# Patient Record
Sex: Male | Born: 1955
Health system: Southern US, Community
[De-identification: ages and names within clinical notes are randomized; demographics above are authoritative.]

## PROBLEM LIST (undated history)

## (undated) DIAGNOSIS — E119 Type 2 diabetes mellitus without complications: Secondary | ICD-10-CM

## (undated) DIAGNOSIS — I10 Essential (primary) hypertension: Secondary | ICD-10-CM

## (undated) DIAGNOSIS — C4491 Basal cell carcinoma of skin, unspecified: Secondary | ICD-10-CM

## (undated) HISTORY — DX: Essential (primary) hypertension: I10

## (undated) HISTORY — DX: Type 2 diabetes mellitus without complications: E11.9

## (undated) HISTORY — PX: FINGER FRACTURE SURGERY: SHX638

## (undated) HISTORY — PX: EYE SURGERY: SHX253

## (undated) HISTORY — PX: BASAL CELL CARCINOMA EXCISION: SHX1214

## (undated) HISTORY — PX: WISDOM TOOTH EXTRACTION: SHX21

---
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
## 2001-07-06 ENCOUNTER — Encounter: Payer: Self-pay | Admitting: Emergency Medicine

## 2001-07-07 ENCOUNTER — Ambulatory Visit (HOSPITAL_COMMUNITY): Admission: RE | Admit: 2001-07-07 | Discharge: 2001-07-08 | Payer: Self-pay | Admitting: Cardiology

## 2004-06-28 ENCOUNTER — Ambulatory Visit (HOSPITAL_BASED_OUTPATIENT_CLINIC_OR_DEPARTMENT_OTHER): Admission: RE | Admit: 2004-06-28 | Discharge: 2004-06-28 | Payer: Self-pay | Admitting: Otolaryngology

## 2015-01-01 ENCOUNTER — Ambulatory Visit
Admission: RE | Admit: 2015-01-01 | Discharge: 2015-01-01 | Disposition: A | Discharge status: Home / Self Care | Payer: BLUE CROSS/BLUE SHIELD | Source: Ambulatory Visit | Attending: Physician Assistant | Admitting: Physician Assistant

## 2015-01-01 ENCOUNTER — Other Ambulatory Visit: Payer: Self-pay | Admitting: Physician Assistant

## 2015-01-01 DIAGNOSIS — J209 Acute bronchitis, unspecified: Secondary | ICD-10-CM

## 2017-09-04 ENCOUNTER — Other Ambulatory Visit (HOSPITAL_COMMUNITY): Payer: Self-pay | Admitting: Family Medicine

## 2017-09-04 DIAGNOSIS — R0989 Other specified symptoms and signs involving the circulatory and respiratory systems: Secondary | ICD-10-CM

## 2017-09-08 ENCOUNTER — Ambulatory Visit (HOSPITAL_COMMUNITY)
Admission: RE | Admit: 2017-09-08 | Discharge: 2017-09-08 | Disposition: A | Discharge status: Home / Self Care | Payer: 59 | Source: Ambulatory Visit | Attending: Vascular Surgery | Admitting: Vascular Surgery

## 2017-09-08 DIAGNOSIS — R0989 Other specified symptoms and signs involving the circulatory and respiratory systems: Secondary | ICD-10-CM | POA: Diagnosis present

## 2018-01-20 DIAGNOSIS — I10 Essential (primary) hypertension: Secondary | ICD-10-CM | POA: Diagnosis not present

## 2018-01-20 DIAGNOSIS — E119 Type 2 diabetes mellitus without complications: Secondary | ICD-10-CM | POA: Diagnosis not present

## 2018-01-20 DIAGNOSIS — E785 Hyperlipidemia, unspecified: Secondary | ICD-10-CM | POA: Diagnosis not present

## 2018-01-20 DIAGNOSIS — Z794 Long term (current) use of insulin: Secondary | ICD-10-CM | POA: Diagnosis not present

## 2018-03-23 DIAGNOSIS — E1169 Type 2 diabetes mellitus with other specified complication: Secondary | ICD-10-CM | POA: Diagnosis not present

## 2018-03-23 DIAGNOSIS — E785 Hyperlipidemia, unspecified: Secondary | ICD-10-CM | POA: Diagnosis not present

## 2018-03-23 DIAGNOSIS — I10 Essential (primary) hypertension: Secondary | ICD-10-CM | POA: Diagnosis not present

## 2018-03-23 DIAGNOSIS — Z794 Long term (current) use of insulin: Secondary | ICD-10-CM | POA: Diagnosis not present

## 2018-04-10 DIAGNOSIS — H25043 Posterior subcapsular polar age-related cataract, bilateral: Secondary | ICD-10-CM | POA: Diagnosis not present

## 2018-04-10 DIAGNOSIS — H52221 Regular astigmatism, right eye: Secondary | ICD-10-CM | POA: Diagnosis not present

## 2018-04-10 DIAGNOSIS — E119 Type 2 diabetes mellitus without complications: Secondary | ICD-10-CM | POA: Diagnosis not present

## 2018-04-10 DIAGNOSIS — H5213 Myopia, bilateral: Secondary | ICD-10-CM | POA: Diagnosis not present

## 2018-05-07 DIAGNOSIS — H2513 Age-related nuclear cataract, bilateral: Secondary | ICD-10-CM | POA: Diagnosis not present

## 2018-05-14 ENCOUNTER — Other Ambulatory Visit: Payer: Self-pay

## 2018-05-14 ENCOUNTER — Encounter: Payer: Self-pay | Admitting: Podiatry

## 2018-05-14 ENCOUNTER — Ambulatory Visit: Payer: BLUE CROSS/BLUE SHIELD | Admitting: Podiatry

## 2018-05-14 DIAGNOSIS — L84 Corns and callosities: Secondary | ICD-10-CM | POA: Diagnosis not present

## 2018-05-14 DIAGNOSIS — E1151 Type 2 diabetes mellitus with diabetic peripheral angiopathy without gangrene: Secondary | ICD-10-CM

## 2018-05-14 DIAGNOSIS — Q828 Other specified congenital malformations of skin: Secondary | ICD-10-CM

## 2018-05-14 DIAGNOSIS — M79675 Pain in left toe(s): Secondary | ICD-10-CM | POA: Diagnosis not present

## 2018-05-14 DIAGNOSIS — B353 Tinea pedis: Secondary | ICD-10-CM

## 2018-05-14 DIAGNOSIS — M79674 Pain in right toe(s): Secondary | ICD-10-CM | POA: Diagnosis not present

## 2018-05-14 DIAGNOSIS — B351 Tinea unguium: Secondary | ICD-10-CM

## 2018-05-14 DIAGNOSIS — I10 Essential (primary) hypertension: Secondary | ICD-10-CM

## 2018-05-14 DIAGNOSIS — E119 Type 2 diabetes mellitus without complications: Secondary | ICD-10-CM

## 2018-05-14 MED ORDER — KETOCONAZOLE 2 % EX CREA: TOPICAL_CREAM | Freq: Every day | CUTANEOUS | Status: DC

## 2018-05-14 MED ORDER — NONFORMULARY OR COMPOUNDED ITEM: 11 refills | Status: AC

## 2018-05-14 NOTE — Patient Instructions (Addendum)
Onychomycosis/Fungal Toenails  WHAT IS IT? An infection that lies within the keratin of your nail plate that is caused by a fungus.  WHY ME? Fungal infections affect all ages, sexes, races, and creeds.  There may be many factors that predispose you to a fungal infection such as age, coexisting medical conditions such as diabetes, or an autoimmune disease; stress, medications, fatigue, genetics, etc.  Bottom line: fungus thrives in a warm, moist environment and your shoes offer such a location.  IS IT CONTAGIOUS? Theoretically, yes.  You do not want to share shoes, nail clippers or files with someone who has fungal toenails.  Walking around barefoot in the same room or sleeping in the same bed is unlikely to transfer the organism.  It is important to realize, however, that fungus can spread easily from one nail to the next on the same foot.  HOW DO WE TREAT THIS?  There are several ways to treat this condition.  Treatment may depend on many factors such as age, medications, pregnancy, liver and kidney conditions, etc.  It is best to ask your doctor which options are available to you.  1. No treatment.   Unlike many other medical concerns, you can live with this condition.  However for many people this can be a painful condition and may lead to ingrown toenails or a bacterial infection.  It is recommended that you keep the nails cut short to help reduce the amount of fungal nail. 2. Topical treatment.  These range from herbal remedies to prescription strength nail lacquers.  About 40-50% effective, topicals require twice daily application for approximately 9 to 12 months or until an entirely new nail has grown out.  The most effective topicals are medical grade medications available through physicians offices. 3. Oral antifungal medications.  With an 80-90% cure rate, the most common oral medication requires 3 to 4 months of therapy and stays in your system for a year as the new nail grows out.  Oral  antifungal medications do require blood work to make sure it is a safe drug for you.  A liver function panel will be performed prior to starting the medication and after the first month of treatment.  It is important to have the blood work performed to avoid any harmful side effects.  In general, this medication safe but blood work is required. 4. Laser Therapy.  This treatment is performed by applying a specialized laser to the affected nail plate.  This therapy is noninvasive, fast, and non-painful.  It is not covered by insurance and is therefore, out of pocket.  The results have been very good with a 80-95% cure rate.  [facility] is the only practice in the area to offer this therapy. Permanent Nail Avulsion.  Removing the entire nail so that a new nail will not grow back.Athlete's Foot Athlete's foot (tinea pedis) is a fungal infection of the skin on the feet. It often occurs on the skin that is between or underneath the toes. It can also occur on the soles of the feet. The infection can spread from person to person (is contagious). What are the causes? Athlete's foot is caused by a fungus. This fungus grows in warm, moist places. Most people get athlete's foot by sharing shower stalls, towels, and wet floors with someone who is infected. Not washing your feet or changing your socks often enough can contribute to athlete's foot. What increases the risk? This condition is more likely to develop in:    Men.  People who have a weak body defense system (immune system).  People who have diabetes.  People who use public showers, such as at a gym.  People who wear heavy-duty shoes, such as industrial or military shoes.  Seasons with warm, humid weather.  What are the signs or symptoms? Symptoms of this condition include:  Itchy areas between the toes or on the soles of the feet.  White, flaky, or scaly areas between the toes or on the soles of the feet.  Very itchy small blisters  between the toes or on the soles of the feet.  Small cuts on the skin. These cuts can become infected.  Thick or discolored toenails.  How is this diagnosed? This condition is diagnosed with a medical history and physical exam. Your health care provider may also take a skin or toenail sample to be examined. How is this treated? Treatment for this condition includes antifungal medicines. These may be applied as powders, ointments, or creams. In severe cases, an oral antifungal medicine may be given. Follow these instructions at home:  Apply or take over-the-counter and prescription medicines only as told by your health care provider.  Keep all follow-up visits as told by your health care provider. This is important.  Do not scratch your feet.  Keep your feet dry: ? Wear cotton or wool socks. Change your socks every day or if they become wet. ? Wear shoes that allow air to circulate, such as sandals or canvas tennis shoes.  Wash and dry your feet: ? Every day or as told by your health care provider. ? After exercising. ? Including the area between your toes.  Do not share towels, nail clippers, or other personal items that touch your feet with others.  If you have diabetes, keep your blood sugar under control. How is this prevented?  Do not share towels.  Wear sandals in wet areas, such as locker rooms and shared showers.  Keep your feet dry: ? Wear cotton or wool socks. Change your socks every day or if they become wet. ? Wear shoes that allow air to circulate, such as sandals or canvas tennis shoes.  Wash and dry your feet after exercising. Pay attention to the area between your toes. Contact a health care provider if:  You have a fever.  You have swelling, soreness, warmth, or redness in your foot.  You are not getting better with treatment.  Your symptoms get worse.  You have new symptoms. This information is not intended to replace advice given to you by your  health care provider. Make sure you discuss any questions you have with your health care provider. Document Released: 10/10/2000 Document Revised: 03/20/2016 Document Reviewed: 04/16/2015 Elsevier Interactive Patient Education  2018 Elsevier Inc.  Diabetes and Foot Care Diabetes may cause you to have problems because of poor blood supply (circulation) to your feet and legs. This may cause the skin on your feet to become thinner, break easier, and heal more slowly. Your skin may become dry, and the skin may peel and crack. You may also have nerve damage in your legs and feet causing decreased feeling in them. You may not notice minor injuries to your feet that could lead to infections or more serious problems. Taking care of your feet is one of the most important things you can do for yourself. Follow these instructions at home:  Wear shoes at all times, even in the house. Do not go barefoot. Bare feet are easily   injured.  Check your feet daily for blisters, cuts, and redness. If you cannot see the bottom of your feet, use a mirror or ask someone for help.  Wash your feet with warm water (do not use hot water) and mild soap. Then pat your feet and the areas between your toes until they are completely dry. Do not soak your feet as this can dry your skin.  Apply a moisturizing lotion or petroleum jelly (that does not contain alcohol and is unscented) to the skin on your feet and to dry, brittle toenails. Do not apply lotion between your toes.  Trim your toenails straight across. Do not dig under them or around the cuticle. File the edges of your nails with an emery board or nail file.  Do not cut corns or calluses or try to remove them with medicine.  Wear clean socks or stockings every day. Make sure they are not too tight. Do not wear knee-high stockings since they may decrease blood flow to your legs.  Wear shoes that fit properly and have enough cushioning. To break in new shoes, wear them  for just a few hours a day. This prevents you from injuring your feet. Always look in your shoes before you put them on to be sure there are no objects inside.  Do not cross your legs. This may decrease the blood flow to your feet.  If you find a minor scrape, cut, or break in the skin on your feet, keep it and the skin around it clean and dry. These areas may be cleansed with mild soap and water. Do not cleanse the area with peroxide, alcohol, or iodine.  When you remove an adhesive bandage, be sure not to damage the skin around it.  If you have a wound, look at it several times a day to make sure it is healing.  Do not use heating pads or hot water bottles. They may burn your skin. If you have lost feeling in your feet or legs, you may not know it is happening until it is too late.  Make sure your health care provider performs a complete foot exam at least annually or more often if you have foot problems. Report any cuts, sores, or bruises to your health care provider immediately. Contact a health care provider if:  You have an injury that is not healing.  You have cuts or breaks in the skin.  You have an ingrown nail.  You notice redness on your legs or feet.  You feel burning or tingling in your legs or feet.  You have pain or cramps in your legs and feet.  Your legs or feet are numb.  Your feet always feel cold. Get help right away if:  There is increasing redness, swelling, or pain in or around a wound.  There is a red line that goes up your leg.  Pus is coming from a wound.  You develop a fever or as directed by your health care provider.  You notice a bad smell coming from an ulcer or wound. This information is not intended to replace advice given to you by your health care provider. Make sure you discuss any questions you have with your health care provider. Document Released: 10/10/2000 Document Revised: 03/20/2016 Document Reviewed: 03/22/2013 Elsevier  Interactive Patient Education  2017 Elsevier Inc.  

## 2018-05-16 ENCOUNTER — Encounter: Payer: Self-pay | Admitting: Podiatry

## 2018-05-16 DIAGNOSIS — E119 Type 2 diabetes mellitus without complications: Secondary | ICD-10-CM | POA: Insufficient documentation

## 2018-05-16 DIAGNOSIS — I10 Essential (primary) hypertension: Secondary | ICD-10-CM | POA: Insufficient documentation

## 2018-05-16 MED ORDER — KETOCONAZOLE 2 % EX CREA: 1.0000 "application " | TOPICAL_CREAM | Freq: Every day | CUTANEOUS | 0 refills | Status: AC

## 2018-05-16 NOTE — Progress Notes (Signed)
Subjective: Manuel Romero is a 62 yo WM who presents today with cc of thick, discolored, dystrophic toenails b/l feet. He states he was recently diagnosed with diabetes.  Pain is aggravated when wearing enclosed shoe gear. Pain is relieved with periodic professional debridement.  Medical History   Date Unknown HTN (hypertension)  Date Unknown Type 2 diabetes mellitus ([place])     Surgical History    None   Tobacco History    Smoking Status  Never Smoker  Smokeless Tobacco Status  Never Used   Family History    Brother Diabetes    Neuropathy     ROS: Per HPI unless specifically indicated in ROS section   Objective: There were no vitals filed for this visit. Vascular Examination: Capillary refill time <3 seconds x 10 digits. Dorsalis pedis and posterior tibial pulses faintly palpable b/l Sparse digital hair x 10 digits Skin temperature warm to warm b/l  Dermatological Examination: Skin thin and atrophic b/l. Chronic venous stasis changes noted b/l ankles/feet Toenails 1-5 b/l discolored, thick, dystrophic with subungual debris and pain with palpation to nailbeds due to thickness of nails. Hyperkeratotic lesions noted submetatarsal head 1 right foot, submetatarsal head 2 left foot, plantarmedial hallux IPJ b/l, distal tip left 2nd digit Diffuse scaling with mild foot odor b/l  Musculoskeletal: Muscle strength 5/5 to all LE muscle groups  Neurological: Sensation intact with 10 gram monofilament. Vibratory sensation intact.  Assessment: 1. Painful onychomycosis toenails 1-5 b/l  2. Calluses:  submetatarsal head 1 right foot, submetatarsal head 2 left foot, plantarmedial hallux IPJ b/l,  3. Corn distal tip left 2nd digit 4. Tinea pedis b/l 5. NIDDM with early PAD  ABI report from September 09, 2107 (Dr. Deitra Mayo) Toe brachial indices normal on right; abnormal on left.  Plan: 1. Discussed diabetic foot care principles. Dispensed education materials on diabetic  foot care, onychomycosis and tinea pedis. 2. Toenails 1-5 b/l were debrided in length and girth without iatrogenic bleeding. 2.  Hyperkeratotic lesions debrided: submetatarsal head 1 right foot, submetatarsal head 2 left foot, plantarmedial hallux IPJ b/l; Corn distal tip left 2nd digit 3. Patient to continue soft, supportive shoe gear. Will check diabetic shoe benefits due to his callosities and need to offload these areas. 4. Patient to report any pedal injuries to medical professional immediately. 5. Rx written for Ketoconazole Cream for tinea pedis to be applied once daily to both feet and between toes once daily x 30 days. 6. Rx for Fluconazole/Terbinafine Solution for onychomycosis. Apply once daily to each toenail. 7. Follow up 3 months.  8. Patient to call should there be a concern in the interim.

## 2018-05-27 DIAGNOSIS — H2512 Age-related nuclear cataract, left eye: Secondary | ICD-10-CM | POA: Diagnosis not present

## 2018-05-27 DIAGNOSIS — Z961 Presence of intraocular lens: Secondary | ICD-10-CM | POA: Diagnosis not present

## 2018-05-27 DIAGNOSIS — H25811 Combined forms of age-related cataract, right eye: Secondary | ICD-10-CM | POA: Diagnosis not present

## 2018-05-28 DIAGNOSIS — H25011 Cortical age-related cataract, right eye: Secondary | ICD-10-CM | POA: Diagnosis not present

## 2018-05-28 DIAGNOSIS — H25041 Posterior subcapsular polar age-related cataract, right eye: Secondary | ICD-10-CM | POA: Diagnosis not present

## 2018-05-28 DIAGNOSIS — H2511 Age-related nuclear cataract, right eye: Secondary | ICD-10-CM | POA: Diagnosis not present

## 2018-06-15 DIAGNOSIS — S0101XA Laceration without foreign body of scalp, initial encounter: Secondary | ICD-10-CM | POA: Diagnosis not present

## 2018-06-17 DIAGNOSIS — H2511 Age-related nuclear cataract, right eye: Secondary | ICD-10-CM | POA: Diagnosis not present

## 2018-06-22 DIAGNOSIS — I10 Essential (primary) hypertension: Secondary | ICD-10-CM | POA: Diagnosis not present

## 2018-06-22 DIAGNOSIS — E1169 Type 2 diabetes mellitus with other specified complication: Secondary | ICD-10-CM | POA: Diagnosis not present

## 2018-06-22 DIAGNOSIS — Z794 Long term (current) use of insulin: Secondary | ICD-10-CM | POA: Diagnosis not present

## 2018-06-22 DIAGNOSIS — E785 Hyperlipidemia, unspecified: Secondary | ICD-10-CM | POA: Diagnosis not present

## 2018-06-22 DIAGNOSIS — Z713 Dietary counseling and surveillance: Secondary | ICD-10-CM | POA: Diagnosis not present

## 2018-07-20 DIAGNOSIS — B351 Tinea unguium: Secondary | ICD-10-CM | POA: Diagnosis not present

## 2018-07-20 DIAGNOSIS — L03116 Cellulitis of left lower limb: Secondary | ICD-10-CM | POA: Diagnosis not present

## 2018-08-16 ENCOUNTER — Ambulatory Visit: Payer: BLUE CROSS/BLUE SHIELD | Admitting: Podiatry

## 2018-08-16 ENCOUNTER — Other Ambulatory Visit: Payer: Self-pay

## 2018-08-16 DIAGNOSIS — E1151 Type 2 diabetes mellitus with diabetic peripheral angiopathy without gangrene: Secondary | ICD-10-CM | POA: Diagnosis not present

## 2018-08-16 DIAGNOSIS — M79675 Pain in left toe(s): Secondary | ICD-10-CM

## 2018-08-16 DIAGNOSIS — L84 Corns and callosities: Secondary | ICD-10-CM

## 2018-08-16 DIAGNOSIS — B351 Tinea unguium: Secondary | ICD-10-CM

## 2018-08-16 DIAGNOSIS — M79674 Pain in right toe(s): Secondary | ICD-10-CM | POA: Diagnosis not present

## 2018-08-16 DIAGNOSIS — Q828 Other specified congenital malformations of skin: Secondary | ICD-10-CM

## 2018-08-24 ENCOUNTER — Encounter: Payer: Self-pay | Admitting: Podiatry

## 2018-08-24 NOTE — Progress Notes (Signed)
Subjective: Manuel Romero presents today for diabetic foot follow up. He states he never received prescription from [facility]. They called him and told him he needed to pick up his prescription in [place], whereas they had delivered prescriptions in the past to patients.     Manuel Romero voices no other pedal complaints on today's visit.   Objective:  Neurovascular status unchanged from last examination  Dermatological Examination: Skin thin and atrophic with chronic vensous stasis skin changes noted b/l  Tinea pedis resolved b/l.  Toenails 1-5 b/l discolored, thick, dystrophic with subungual debris and pain with palpation to nailbeds due to thickness of nails.  Hyperkeratosis noted submet head 1 right foot, submet head 2 left foot, plantarmedial hallux IPJ b/l, distal tip left 2nd digit  Musculoskeletal: Muscle strength 5/5 to all LE muscle groups  Assessment: 1. Painful onychomycosis toenails 1-5 b/l 2. Calluses x 5:  submet head 1 right foot, submet head 2 left foot, plantarmedial hallux IPJ b/l, distal tip left 2nd digit 3. NIDDM with early peripheral arterial disease  Plan: 1. Toenails 1-5 b/l were debrided in length and girth without iatrogenic bleeding. 2. Callus(es) pared: submet head 1 right foot, submet head 2 left foot, plantarmedial hallux IPJ b/l, distal tip left 2nd digit 3. Discussed other options for topical antifungals for onychomycosis. He would like to think about it.  4. We discussed diabetic shoes/workboots with Pedorthist Velora Heckler, who informed his benefits do not cover diabetic shoes. Manuel Romero states he cannot afford the cost of the shoe.  5. Patient to continue soft, supportive shoe gear 6. Patient to report any pedal injuries to medical professional  7. Follow up 3 months.  8. Patient/POA to call should there be a concern in the interim.

## 2018-09-14 DIAGNOSIS — E1169 Type 2 diabetes mellitus with other specified complication: Secondary | ICD-10-CM | POA: Diagnosis not present

## 2018-09-14 DIAGNOSIS — E785 Hyperlipidemia, unspecified: Secondary | ICD-10-CM | POA: Diagnosis not present

## 2018-09-14 DIAGNOSIS — I10 Essential (primary) hypertension: Secondary | ICD-10-CM | POA: Diagnosis not present

## 2018-10-04 DIAGNOSIS — Z23 Encounter for immunization: Secondary | ICD-10-CM | POA: Diagnosis not present

## 2018-11-01 ENCOUNTER — Ambulatory Visit: Payer: BLUE CROSS/BLUE SHIELD | Admitting: Podiatry

## 2018-11-01 DIAGNOSIS — M79675 Pain in left toe(s): Secondary | ICD-10-CM

## 2018-11-01 DIAGNOSIS — E1151 Type 2 diabetes mellitus with diabetic peripheral angiopathy without gangrene: Secondary | ICD-10-CM | POA: Diagnosis not present

## 2018-11-01 DIAGNOSIS — Q828 Other specified congenital malformations of skin: Secondary | ICD-10-CM | POA: Diagnosis not present

## 2018-11-01 DIAGNOSIS — L84 Corns and callosities: Secondary | ICD-10-CM

## 2018-11-01 DIAGNOSIS — B351 Tinea unguium: Secondary | ICD-10-CM | POA: Diagnosis not present

## 2018-11-01 DIAGNOSIS — M79674 Pain in right toe(s): Secondary | ICD-10-CM | POA: Diagnosis not present

## 2018-11-01 NOTE — Patient Instructions (Signed)
Diabetes Mellitus and Foot Care  Foot care is an important part of your health, especially when you have diabetes. Diabetes may cause you to have problems because of poor blood flow (circulation) to your feet and legs, which can cause your skin to:   Become thinner and drier.   Break more easily.   Heal more slowly.   Peel and crack.  You may also have nerve damage (neuropathy) in your legs and feet, causing decreased feeling in them. This means that you may not notice minor injuries to your feet that could lead to more serious problems. Noticing and addressing any potential problems early is the best way to prevent future foot problems.  How to care for your feet  Foot hygiene   Wash your feet daily with warm water and mild soap. Do not use hot water. Then, pat your feet and the areas between your toes until they are completely dry. Do not soak your feet as this can dry your skin.   Trim your toenails straight across. Do not dig under them or around the cuticle. File the edges of your nails with an emery board or nail file.   Apply a moisturizing lotion or petroleum jelly to the skin on your feet and to dry, brittle toenails. Use lotion that does not contain alcohol and is unscented. Do not apply lotion between your toes.  Shoes and socks   Wear clean socks or stockings every day. Make sure they are not too tight. Do not wear knee-high stockings since they may decrease blood flow to your legs.   Wear shoes that fit properly and have enough cushioning. Always look in your shoes before you put them on to be sure there are no objects inside.   To break in new shoes, wear them for just a few hours a day. This prevents injuries on your feet.  Wounds, scrapes, corns, and calluses   Check your feet daily for blisters, cuts, bruises, sores, and redness. If you cannot see the bottom of your feet, use a mirror or ask someone for help.   Do not cut corns or calluses or try to remove them with medicine.   If you  find a minor scrape, cut, or break in the skin on your feet, keep it and the skin around it clean and dry. You may clean these areas with mild soap and water. Do not clean the area with peroxide, alcohol, or iodine.   If you have a wound, scrape, corn, or callus on your foot, look at it several times a day to make sure it is healing and not infected. Check for:  ? Redness, swelling, or pain.  ? Fluid or blood.  ? Warmth.  ? Pus or a bad smell.  General instructions   Do not cross your legs. This may decrease blood flow to your feet.   Do not use heating pads or hot water bottles on your feet. They may burn your skin. If you have lost feeling in your feet or legs, you may not know this is happening until it is too late.   Protect your feet from hot and cold by wearing shoes, such as at the beach or on hot pavement.   Schedule a complete foot exam at least once a year (annually) or more often if you have foot problems. If you have foot problems, report any cuts, sores, or bruises to your health care provider immediately.  Contact a health care provider if:     You have a medical condition that increases your risk of infection and you have any cuts, sores, or bruises on your feet.   You have an injury that is not healing.   You have redness on your legs or feet.   You feel burning or tingling in your legs or feet.   You have pain or cramps in your legs and feet.   Your legs or feet are numb.   Your feet always feel cold.   You have pain around a toenail.  Get help right away if:   You have a wound, scrape, corn, or callus on your foot and:  ? You have pain, swelling, or redness that gets worse.  ? You have fluid or blood coming from the wound, scrape, corn, or callus.  ? Your wound, scrape, corn, or callus feels warm to the touch.  ? You have pus or a bad smell coming from the wound, scrape, corn, or callus.  ? You have a fever.  ? You have a red line going up your leg.  Summary   Check your feet every day  for cuts, sores, red spots, swelling, and blisters.   Moisturize feet and legs daily.   Wear shoes that fit properly and have enough cushioning.   If you have foot problems, report any cuts, sores, or bruises to your health care provider immediately.   Schedule a complete foot exam at least once a year (annually) or more often if you have foot problems.  This information is not intended to replace advice given to you by your health care provider. Make sure you discuss any questions you have with your health care provider.  Document Released: 10/10/2000 Document Revised: 11/25/2017 Document Reviewed: 11/14/2016  Elsevier Interactive Patient Education  2019 Elsevier Inc.

## 2018-11-22 ENCOUNTER — Encounter: Payer: Self-pay | Admitting: Podiatry

## 2018-11-22 NOTE — Progress Notes (Signed)
Subjective: Manuel Romero is a 63 y.o. y.o. male who presents for preventative foot care today with PAD and cc of painful, discolored, thick toenails and painful calluses and corns which interfere with daily activities. Pain is aggravated when wearing enclosed shoe gear. Pain is relieved with periodic professional debridement.  He states he could not afford the compounded topical antifungal prescribed on last visit.  [name], PA is his PCP.   Current Outpatient Medications:  .  atorvastatin (LIPITOR) 20 MG tablet, Take 20 mg by mouth daily., Disp: , Rfl: 1 .  BESIVANCE 0.6 % SUSP, INSTILL 1 DROP INTO RIGHT EYE THREE TIMES DAILY, Disp: , Rfl: 1 .  DUREZOL 0.05 % EMUL, INSTILL 1 DROP INTO RIGHT EYE THREE TIMES DAILY AS DIRECTED, Disp: , Rfl: 1 .  metFORMIN (GLUCOPHAGE) 1000 MG tablet, Take 1,000 mg by mouth 2 (two) times daily with a meal., Disp: , Rfl: 1 .  NON FORMULARY, [facility]  Onychomycosis Nail Lacquer -  Fluconazole 2%, Terbinafine 1% DMSO Apply to affected nail once daily Qty. 120 gm 3 refills, Disp: , Rfl:  .  NONFORMULARY OR COMPOUNDED ITEM, [facility]:  Onychomycosis Nail Lacquer - Fluconazole 2%, Terbinafine 1%, DMSO, apply to affected area daily., Disp: 120 each, Rfl: 11 .  PROLENSA 0.07 % SOLN, INSTILL 1 DROP INTO RIGHT EYE AT BEDTIME AS DIRECTED, Disp: , Rfl: 1 .  valsartan-hydrochlorothiazide (DIOVAN-HCT) 80-12.5 MG tablet, TAKE 1 TABLET BY MOUTH ONCE DAILY FOR 90 DAYS, Disp: , Rfl: 1  No Known Allergies  Objective: Vascular Examination: Capillary refill time <3 seconds x 10 digits  Dorsalis pedis pulses faintly palpable b/l  Posterior tibial pulses faintly palpable b/l  Sparse digital hair x 10 digits  Skin temperature gradient WNL b/l  Dermatological Examination: Skin thin, shiny and atrophic b/l  Chronic venous stasis changes noted b/l ankles/feet  Toenails 1-5 b/l discolored, thick, dystrophic with subungual debris and pain with  palpation to nailbeds due to thickness of nails.  Hyperkeratotic lesions submet head 1 right foot, submet head 2 left foot, hallux IPJ b/l, distal tip left 2nd digit  Hyperkeratotic lesion noted submetatarsal head  Hyperkeratotic lesion noted dorsal PIPJ  Musculoskeletal: Muscle strength 5/5 to all LE muscle groups  Neurological: Sensation diminished with 10 gram monofilament.   Assessment: 1. Painful onychomycosis toenails 1-5 b/l 2. Calluses  submet head 1 right foot, submet head 2 left foot, hallux IPJ b/l 3. Corns distal tip left 2nd digit 4. NIDDM with Peripheral arterial disease  Plan: 1. Discuss diabetic foot care principles. Literature dispensed. 2. Toenails 1-5 b/l were debrided in length and girth without iatrogenic bleeding. 3. Hyperkeratotic lesion(s)  pared with sterile chisel blade and gently smoothed with burr distal tip left 2nd digit and submet head 1 right foot, submet head 2 left foot, hallux IPJ b/ 4. Patient to continue soft, supportive shoe gear 5. Patient to report any pedal injuries to medical professional  6. Follow up 3 months. Patient/POA to call should there be a concern in the interim.

## 2018-11-25 DIAGNOSIS — Z0000 Encounter for general adult medical examination without abnormal findings: Secondary | ICD-10-CM | POA: Diagnosis not present

## 2018-11-25 DIAGNOSIS — N509 Disorder of male genital organs, unspecified: Secondary | ICD-10-CM | POA: Diagnosis not present

## 2018-11-25 DIAGNOSIS — Z1159 Encounter for screening for other viral diseases: Secondary | ICD-10-CM | POA: Diagnosis not present

## 2018-11-30 ENCOUNTER — Other Ambulatory Visit: Payer: Self-pay | Admitting: Family Medicine

## 2018-11-30 DIAGNOSIS — N5089 Other specified disorders of the male genital organs: Secondary | ICD-10-CM

## 2018-12-06 ENCOUNTER — Ambulatory Visit
Admission: RE | Admit: 2018-12-06 | Discharge: 2018-12-06 | Disposition: A | Discharge status: Home / Self Care | Payer: BLUE CROSS/BLUE SHIELD | Source: Ambulatory Visit | Attending: Family Medicine | Admitting: Family Medicine

## 2018-12-06 DIAGNOSIS — N5089 Other specified disorders of the male genital organs: Secondary | ICD-10-CM

## 2018-12-06 DIAGNOSIS — N503 Cyst of epididymis: Secondary | ICD-10-CM | POA: Diagnosis not present

## 2019-01-31 ENCOUNTER — Encounter: Payer: Self-pay | Admitting: Podiatry

## 2019-01-31 ENCOUNTER — Ambulatory Visit: Payer: BLUE CROSS/BLUE SHIELD | Admitting: Podiatry

## 2019-01-31 ENCOUNTER — Other Ambulatory Visit: Payer: Self-pay

## 2019-01-31 VITALS — Temp 96.5°F

## 2019-01-31 DIAGNOSIS — L84 Corns and callosities: Secondary | ICD-10-CM

## 2019-01-31 DIAGNOSIS — B351 Tinea unguium: Secondary | ICD-10-CM | POA: Diagnosis not present

## 2019-01-31 DIAGNOSIS — M79674 Pain in right toe(s): Secondary | ICD-10-CM | POA: Diagnosis not present

## 2019-01-31 DIAGNOSIS — M79675 Pain in left toe(s): Secondary | ICD-10-CM | POA: Diagnosis not present

## 2019-01-31 DIAGNOSIS — E1151 Type 2 diabetes mellitus with diabetic peripheral angiopathy without gangrene: Secondary | ICD-10-CM | POA: Diagnosis not present

## 2019-02-02 NOTE — Progress Notes (Signed)
Subjective: Patient presents today for preventative diabetic foot care. He states he is taking better care of his feet now.   He is seen today for chronic, elongated, painful, mycotic toenails and plantar callosities. Toenail pain is aggravated when wearing enclosed shoe gear. Pain is getting progressively worse and relieved with periodic professional debridement.  [name], PA is his PCP.   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   Current Outpatient Medications:  .  atorvastatin (LIPITOR) 20 MG tablet, Take 20 mg by mouth daily., Disp: , Rfl: 1 .  BESIVANCE 0.6 % SUSP, INSTILL 1 DROP INTO RIGHT EYE THREE TIMES DAILY, Disp: , Rfl: 1 .  DUREZOL 0.05 % EMUL, INSTILL 1 DROP INTO RIGHT EYE THREE TIMES DAILY AS DIRECTED, Disp: , Rfl: 1 .  metFORMIN (GLUCOPHAGE) 1000 MG tablet, Take 1,000 mg by mouth 2 (two) times daily with a meal., Disp: , Rfl: 1 .  NON FORMULARY, [facility]  Onychomycosis Nail Lacquer -  Fluconazole 2%, Terbinafine 1% DMSO Apply to affected nail once daily Qty. 120 gm 3 refills, Disp: , Rfl:  .  NONFORMULARY OR COMPOUNDED ITEM, [facility]:  Onychomycosis Nail Lacquer - Fluconazole 2%, Terbinafine 1%, DMSO, apply to affected area daily., Disp: 120 each, Rfl: 11 .  PROLENSA 0.07 % SOLN, INSTILL 1 DROP INTO RIGHT EYE AT BEDTIME AS DIRECTED, Disp: , Rfl: 1 .  valsartan-hydrochlorothiazide (DIOVAN-HCT) 80-12.5 MG tablet, TAKE 1 TABLET BY MOUTH ONCE DAILY FOR 90 DAYS, Disp: , Rfl: 1   No Known Allergies   Objective:  Vascular Examination: Capillary refill time <3 seconds x 10 digits.  Dorsalis pedis pulses faintly palpable.  Posterior tibial pulses faintly palpable.  Digital hair sparse x 10 digits.  Skin temperature gradient WNL  b/l  Dermatological Examination: Skin thin, shiny and atrophic b/l  Venous stasis skin changes noted b/l LE. No breaks in skin. No  impending wounds.  Toenails 1-5 b/l discolored, thick, dystrophic with subungual debris and pain with palpation to nailbeds due to thickness of nails.  Hyperkeratotic lesion submet head 2 left foot and b/l hallux, distal tip left 2nd digit. No edema, no erythema, no drainage, no flocculence.  Musculoskeletal: Muscle strength 5/5 to all LE muscle groups  Neurological: Sensation intact with 10 gram monofilament.  Vibratory sensation intact.  Assessment: 1. Painful onychomycosis toenails 1-5 b/l 2. Calluses submet head 2 left foot and b/l hallux 3. Corn distal tip left 2nd digit 4. NIDDM with early Peripheral arterial disease  Plan: 1. Toenails 1-5 b/l were debrided in length and girth without iatrogenic bleeding. Calluses pared submet head 2 left foot and b/l hallux utilizing sterile scalpel blade without incident. Corn pared distal tip left 2nd digit utilizing sterile scalpel blade without incident. Patient to continue soft, supportive shoe gear daily. Patient to report any pedal injuries to medical professional immediately. Follow up 10 weeks. Patient/POA to call should there be a concern in the interim.

## 2019-05-06 ENCOUNTER — Encounter: Payer: Self-pay | Admitting: Podiatry

## 2019-05-06 ENCOUNTER — Ambulatory Visit: Payer: BC Managed Care – PPO | Admitting: Podiatry

## 2019-05-06 ENCOUNTER — Other Ambulatory Visit: Payer: Self-pay

## 2019-05-06 DIAGNOSIS — M79674 Pain in right toe(s): Secondary | ICD-10-CM

## 2019-05-06 DIAGNOSIS — M79675 Pain in left toe(s): Secondary | ICD-10-CM | POA: Diagnosis not present

## 2019-05-06 DIAGNOSIS — E1151 Type 2 diabetes mellitus with diabetic peripheral angiopathy without gangrene: Secondary | ICD-10-CM

## 2019-05-06 DIAGNOSIS — B351 Tinea unguium: Secondary | ICD-10-CM

## 2019-05-06 DIAGNOSIS — L84 Corns and callosities: Secondary | ICD-10-CM | POA: Diagnosis not present

## 2019-05-06 NOTE — Patient Instructions (Signed)

## 2019-05-08 NOTE — Progress Notes (Signed)
Subjective: Manuel Romero is a 63 y.o. y.o. male who presents for preventative diabetic foot care today with cc of painful, discolored, thick toenails and painful callus/corn which interfere with daily activities. Pain is aggravated when wearing enclosed shoe gear and relieved with periodic professional debridement.  [name], PA is his PCP.    Current Outpatient Medications:  .  atorvastatin (LIPITOR) 20 MG tablet, Take 20 mg by mouth daily., Disp: , Rfl: 1 .  BESIVANCE 0.6 % SUSP, INSTILL 1 DROP INTO RIGHT EYE THREE TIMES DAILY, Disp: , Rfl: 1 .  DUREZOL 0.05 % EMUL, INSTILL 1 DROP INTO RIGHT EYE THREE TIMES DAILY AS DIRECTED, Disp: , Rfl: 1 .  metFORMIN (GLUCOPHAGE) 1000 MG tablet, Take 1,000 mg by mouth 2 (two) times daily with a meal., Disp: , Rfl: 1 .  NON FORMULARY, [facility]  Onychomycosis Nail Lacquer -  Fluconazole 2%, Terbinafine 1% DMSO Apply to affected nail once daily Qty. 120 gm 3 refills, Disp: , Rfl:  .  NONFORMULARY OR COMPOUNDED ITEM, [facility]:  Onychomycosis Nail Lacquer - Fluconazole 2%, Terbinafine 1%, DMSO, apply to affected area daily., Disp: 120 each, Rfl: 11 .  PROLENSA 0.07 % SOLN, INSTILL 1 DROP INTO RIGHT EYE AT BEDTIME AS DIRECTED, Disp: , Rfl: 1 .  valsartan-hydrochlorothiazide (DIOVAN-HCT) 80-12.5 MG tablet, TAKE 1 TABLET BY MOUTH ONCE DAILY FOR 90 DAYS, Disp: , Rfl: 1  No Known Allergies  Objective: Vascular Examination: Capillary refill time <3 seconds x 10 digits.  Dorsalis pedis pulses faintly palpable b/l.  Posterior tibial pulses faintly palpable b/l.  Digital hair sparse x 10 digits.  Skin temperature gradient WNL b/l.  Dermatological Examination: Skin with noted venous stasis changes b/l LE. No breaks in skin.   Toenails 1-5 b/l discolored, thick, dystrophic with subungual debris and pain with palpation to nailbeds due to thickness of nails.  Hyperkeratotic lesion submet head 2 left foot, b/l hallux, distal tip left  2nd digit. No erythema, no edema, no drainage, no flocculence noted.  Musculoskeletal: Muscle strength 5/5 to all LE muscle groups.  Neurological: Sensation intact 5/5 b/l with 10 gram monofilament.  Vibratory sensation intact b/l.  Assessment: 1.  Painful onychomycosis toenails 1-5 b/l 2.  Callus submet head 2 left foot and b/l hallux 3.  Corn distal tip left 2nd digit 4.  NIDDM  Plan: 1. Continue diabetic foot care principles. Literature dispensed on today. 2. Toenails 1-5 b/l were debrided in length and girth without iatrogenic bleeding. 3. Hyperkeratotic lesion(s) submet head 2 left foot, b/l hallux, distal tip left 2nd digit pared with sterile scalpel blade without incident. 4. Patient to continue soft, supportive shoe gear daily. 5. Patient to report any pedal injuries to medical professional immediately. 6. Follow up 3 months.  7. Patient/POA to call should there be a concern in the interim.

## 2019-07-13 ENCOUNTER — Telehealth: Payer: Self-pay

## 2019-07-13 NOTE — Telephone Encounter (Signed)
Let message for patient to call the office to make appointment.

## 2019-07-22 ENCOUNTER — Telehealth: Payer: Self-pay

## 2019-07-22 NOTE — Telephone Encounter (Signed)
Left voicemail for patient to call the office back to schedule an appointment

## 2019-08-04 ENCOUNTER — Telehealth: Payer: Self-pay | Admitting: Cardiology

## 2019-08-04 NOTE — Telephone Encounter (Signed)
LVM for patient to call and schedule a new patient appointment for chest pain.  See proficient.

## 2019-08-05 ENCOUNTER — Encounter: Payer: Self-pay | Admitting: Podiatry

## 2019-08-05 ENCOUNTER — Ambulatory Visit: Payer: BC Managed Care – PPO | Admitting: Podiatry

## 2019-08-05 ENCOUNTER — Other Ambulatory Visit: Payer: Self-pay

## 2019-08-05 DIAGNOSIS — L84 Corns and callosities: Secondary | ICD-10-CM

## 2019-08-05 DIAGNOSIS — B351 Tinea unguium: Secondary | ICD-10-CM | POA: Diagnosis not present

## 2019-08-05 DIAGNOSIS — M79674 Pain in right toe(s): Secondary | ICD-10-CM | POA: Diagnosis not present

## 2019-08-05 DIAGNOSIS — E119 Type 2 diabetes mellitus without complications: Secondary | ICD-10-CM

## 2019-08-05 DIAGNOSIS — M79675 Pain in left toe(s): Secondary | ICD-10-CM

## 2019-08-05 NOTE — Patient Instructions (Signed)
Diabetes Mellitus and Foot Care Foot care is an important part of your health, especially when you have diabetes. Diabetes may cause you to have problems because of poor blood flow (circulation) to your feet and legs, which can cause your skin to:  Become thinner and drier.  Break more easily.  Heal more slowly.  Peel and crack. You may also have nerve damage (neuropathy) in your legs and feet, causing decreased feeling in them. This means that you may not notice minor injuries to your feet that could lead to more serious problems. Noticing and addressing any potential problems early is the best way to prevent future foot problems. How to care for your feet Foot hygiene  Wash your feet daily with warm water and mild soap. Do not use hot water. Then, pat your feet and the areas between your toes until they are completely dry. Do not soak your feet as this can dry your skin.  Trim your toenails straight across. Do not dig under them or around the cuticle. File the edges of your nails with an emery board or nail file.  Apply a moisturizing lotion or petroleum jelly to the skin on your feet and to dry, brittle toenails. Use lotion that does not contain alcohol and is unscented. Do not apply lotion between your toes. Shoes and socks  Wear clean socks or stockings every day. Make sure they are not too tight. Do not wear knee-high stockings since they may decrease blood flow to your legs.  Wear shoes that fit properly and have enough cushioning. Always look in your shoes before you put them on to be sure there are no objects inside.  To break in new shoes, wear them for just a few hours a day. This prevents injuries on your feet. Wounds, scrapes, corns, and calluses  Check your feet daily for blisters, cuts, bruises, sores, and redness. If you cannot see the bottom of your feet, use a mirror or ask someone for help.  Do not cut corns or calluses or try to remove them with medicine.  If you  find a minor scrape, cut, or break in the skin on your feet, keep it and the skin around it clean and dry. You may clean these areas with mild soap and water. Do not clean the area with peroxide, alcohol, or iodine.  If you have a wound, scrape, corn, or callus on your foot, look at it several times a day to make sure it is healing and not infected. Check for: ? Redness, swelling, or pain. ? Fluid or blood. ? Warmth. ? Pus or a bad smell. General instructions  Do not cross your legs. This may decrease blood flow to your feet.  Do not use heating pads or hot water bottles on your feet. They may burn your skin. If you have lost feeling in your feet or legs, you may not know this is happening until it is too late.  Protect your feet from hot and cold by wearing shoes, such as at the beach or on hot pavement.  Schedule a complete foot exam at least once a year (annually) or more often if you have foot problems. If you have foot problems, report any cuts, sores, or bruises to your health care provider immediately. Contact a health care provider if:  You have a medical condition that increases your risk of infection and you have any cuts, sores, or bruises on your feet.  You have an injury that is not   healing.  You have redness on your legs or feet.  You feel burning or tingling in your legs or feet.  You have pain or cramps in your legs and feet.  Your legs or feet are numb.  Your feet always feel cold.  You have pain around a toenail. Get help right away if:  You have a wound, scrape, corn, or callus on your foot and: ? You have pain, swelling, or redness that gets worse. ? You have fluid or blood coming from the wound, scrape, corn, or callus. ? Your wound, scrape, corn, or callus feels warm to the touch. ? You have pus or a bad smell coming from the wound, scrape, corn, or callus. ? You have a fever. ? You have a red line going up your leg. Summary  Check your feet every day  for cuts, sores, red spots, swelling, and blisters.  Moisturize feet and legs daily.  Wear shoes that fit properly and have enough cushioning.  If you have foot problems, report any cuts, sores, or bruises to your health care provider immediately.  Schedule a complete foot exam at least once a year (annually) or more often if you have foot problems. This information is not intended to replace advice given to you by your health care provider. Make sure you discuss any questions you have with your health care provider. Document Released: 10/10/2000 Document Revised: 11/25/2017 Document Reviewed: 11/14/2016 Elsevier Patient Education  2020 [employer]  

## 2019-08-08 NOTE — Progress Notes (Signed)
Subjective: Manuel Romero is seen today for diabetic foot care follow up of calluses and painful, elongated, thickened toenails 1-5 b/l feet that he cannot cut. Pain interferes with daily activities. Aggravating factor includes wearing enclosed shoe gear and relieved with periodic debridement.  He voices no new pedal problems on today's visit.  Current Outpatient Medications on File Prior to Visit  Medication Sig  . atorvastatin (LIPITOR) 20 MG tablet Take 20 mg by mouth daily.  [name] BESIVANCE 0.6 % SUSP INSTILL 1 DROP INTO RIGHT EYE THREE TIMES DAILY  . DUREZOL 0.05 % EMUL INSTILL 1 DROP INTO RIGHT EYE THREE TIMES DAILY AS DIRECTED  . metFORMIN (GLUCOPHAGE) 1000 MG tablet Take 1,000 mg by mouth 2 (two) times daily with a meal.  . NON FORMULARY [facility]  Onychomycosis Nail Lacquer -  Fluconazole 2%, Terbinafine 1% DMSO Apply to affected nail once daily Qty. 120 gm 3 refills  . NONFORMULARY OR COMPOUNDED ITEM [facility]:  Onychomycosis Nail Lacquer - Fluconazole 2%, Terbinafine 1%, DMSO, apply to affected area daily.  [name] PROLENSA 0.07 % SOLN INSTILL 1 DROP INTO RIGHT EYE AT BEDTIME AS DIRECTED  . valsartan-hydrochlorothiazide (DIOVAN-HCT) 80-12.5 MG tablet TAKE 1 TABLET BY MOUTH ONCE DAILY FOR 90 DAYS   No current facility-administered medications on file prior to visit.      No Known Allergies   Objective:  Vascular Examination: Capillary refill time <3 seconds x 10 digits.  Dorsalis pedis and Posterior tibial pulses faintly palpable b/l.  Digital hair sparse b/l.  Skin temperature gradient WNL b/l.   Dermatological Examination: Skin with chronic venous stasis skin changes b/l.  Skin crack noted sulcus right 5th digit.   Moderately dry skin noted b/l feet.   Toenails 1-5 b/l discolored, thick, dystrophic with subungual debris and pain with palpation to nailbeds due to thickness of nails.  Hyperkeratotic lesion submet head 2 left, submet head 1 right and b/l  hallux with tenderness to palpation. No edema, no erythema, no drainage, no flocculence.  Musculoskeletal: Muscle strength 5/5 to all LE muscle groups  No gross bony deformities b/l.  No pain, crepitus or joint limitation noted with ROM.   Neurological Examination: Protective sensation intact with 10 gram monofilament bilaterally.  Epicritic sensation present bilaterally.  Vibratory sensation intact bilaterally.   Assessment: Painful onychomycosis toenails 1-5 b/l  Calluses submet 1 right foot, submet head 2 left foot and b/l hallux NIDDM  Plan: 1. Toenails 1-5 b/l were debrided in length and girth without iatrogenic bleeding.  2. Calluses pared submetatarsal head(s) 1 right foot, submet head 2 left foot and b/l hallux utilizing sterile scalpel blade without incident. 3. Patient to continue soft, supportive shoe gear. 4. Patient to report any pedal injuries to medical professional immediately. 5. Follow up 13 weeks. 6. Patient/POA to call should there be a concern in the interim.

## 2019-09-01 DIAGNOSIS — Z23 Encounter for immunization: Secondary | ICD-10-CM | POA: Diagnosis not present

## 2019-09-08 DIAGNOSIS — L570 Actinic keratosis: Secondary | ICD-10-CM | POA: Diagnosis not present

## 2019-09-08 DIAGNOSIS — C44311 Basal cell carcinoma of skin of nose: Secondary | ICD-10-CM | POA: Diagnosis not present

## 2019-09-08 DIAGNOSIS — C44519 Basal cell carcinoma of skin of other part of trunk: Secondary | ICD-10-CM | POA: Diagnosis not present

## 2019-09-08 DIAGNOSIS — C44619 Basal cell carcinoma of skin of left upper limb, including shoulder: Secondary | ICD-10-CM | POA: Diagnosis not present

## 2019-09-08 DIAGNOSIS — C4441 Basal cell carcinoma of skin of scalp and neck: Secondary | ICD-10-CM | POA: Diagnosis not present

## 2019-09-19 DIAGNOSIS — C44619 Basal cell carcinoma of skin of left upper limb, including shoulder: Secondary | ICD-10-CM | POA: Diagnosis not present

## 2019-10-13 DIAGNOSIS — C44519 Basal cell carcinoma of skin of other part of trunk: Secondary | ICD-10-CM | POA: Diagnosis not present

## 2019-10-17 DIAGNOSIS — C44519 Basal cell carcinoma of skin of other part of trunk: Secondary | ICD-10-CM | POA: Diagnosis not present

## 2019-10-24 DIAGNOSIS — C44311 Basal cell carcinoma of skin of nose: Secondary | ICD-10-CM | POA: Diagnosis not present

## 2019-11-10 DIAGNOSIS — C4441 Basal cell carcinoma of skin of scalp and neck: Secondary | ICD-10-CM | POA: Diagnosis not present

## 2019-11-10 DIAGNOSIS — L988 Other specified disorders of the skin and subcutaneous tissue: Secondary | ICD-10-CM | POA: Diagnosis not present

## 2019-11-11 ENCOUNTER — Encounter: Payer: Self-pay | Admitting: Podiatry

## 2019-11-11 ENCOUNTER — Other Ambulatory Visit: Payer: Self-pay

## 2019-11-11 ENCOUNTER — Ambulatory Visit: Payer: BC Managed Care – PPO | Admitting: Podiatry

## 2019-11-11 DIAGNOSIS — L84 Corns and callosities: Secondary | ICD-10-CM | POA: Diagnosis not present

## 2019-11-11 DIAGNOSIS — M79675 Pain in left toe(s): Secondary | ICD-10-CM | POA: Diagnosis not present

## 2019-11-11 DIAGNOSIS — E119 Type 2 diabetes mellitus without complications: Secondary | ICD-10-CM

## 2019-11-11 DIAGNOSIS — M79674 Pain in right toe(s): Secondary | ICD-10-CM

## 2019-11-11 DIAGNOSIS — B351 Tinea unguium: Secondary | ICD-10-CM

## 2019-11-11 NOTE — Patient Instructions (Signed)
Onychomycosis/Fungal Toenails  WHAT IS IT? An infection that lies within the keratin of your nail plate that is caused by a fungus.  WHY ME? Fungal infections affect all ages, sexes, races, and creeds.  There may be many factors that predispose you to a fungal infection such as age, coexisting medical conditions such as diabetes, or an autoimmune disease; stress, medications, fatigue, genetics, etc.  Bottom line: fungus thrives in a warm, moist environment and your shoes offer such a location.  IS IT CONTAGIOUS? Theoretically, yes.  You do not want to share shoes, nail clippers or files with someone who has fungal toenails.  Walking around barefoot in the same room or sleeping in the same bed is unlikely to transfer the organism.  It is important to realize, however, that fungus can spread easily from one nail to the next on the same foot.  HOW DO WE TREAT THIS?  There are several ways to treat this condition.  Treatment may depend on many factors such as age, medications, pregnancy, liver and kidney conditions, etc.  It is best to ask your doctor which options are available to you.  1. No treatment.   Unlike many other medical concerns, you can live with this condition.  However for many people this can be a painful condition and may lead to ingrown toenails or a bacterial infection.  It is recommended that you keep the nails cut short to help reduce the amount of fungal nail. 2. Topical treatment.  These range from herbal remedies to prescription strength nail lacquers.  About 40-50% effective, topicals require twice daily application for approximately 9 to 12 months or until an entirely new nail has grown out.  The most effective topicals are medical grade medications available through physicians offices. 3. Oral antifungal medications.  With an 80-90% cure rate, the most common oral medication requires 3 to 4 months of therapy and stays in your system for a year as the new nail grows out.  Oral  antifungal medications do require blood work to make sure it is a safe drug for you.  A liver function panel will be performed prior to starting the medication and after the first month of treatment.  It is important to have the blood work performed to avoid any harmful side effects.  In general, this medication safe but blood work is required. 4. Laser Therapy.  This treatment is performed by applying a specialized laser to the affected nail plate.  This therapy is noninvasive, fast, and non-painful.  It is not covered by insurance and is therefore, out of pocket.  The results have been very good with a 80-95% cure rate.  [facility] is the only practice in the area to offer this therapy. Permanent Nail Avulsion.  Removing the entire nail so that a new nail will not grow back.Corns and Calluses Corns are small areas of thickened skin that occur on the top, sides, or tip of a toe. They contain a cone-shaped core with a point that can press on a nerve below. This causes pain.  Calluses are areas of thickened skin that can occur anywhere on the body, including the hands, fingers, palms, soles of the feet, and heels. Calluses are usually larger than corns. What are the causes? Corns and calluses are caused by rubbing (friction) or pressure, such as from shoes that are too tight or do not fit properly. What increases the risk? Corns are more likely to develop in people who have misshapen   toes (toe deformities), such as hammer toes. Calluses can occur with friction to any area of the skin. They are more likely to develop in people who:  Work with their hands.  Wear shoes that fit poorly, are too tight, or are high-heeled.  Have toe deformities. What are the signs or symptoms? Symptoms of a corn or callus include:  A hard growth on the skin.  Pain or tenderness under the skin.  Redness and swelling.  Increased discomfort while wearing tight-fitting shoes, if your feet are affected. If a  corn or callus becomes infected, symptoms may include:  Redness and swelling that gets worse.  Pain.  Fluid, blood, or pus draining from the corn or callus. How is this diagnosed? Corns and calluses may be diagnosed based on your symptoms, your medical history, and a physical exam. How is this treated? Treatment for corns and calluses may include:  Removing the cause of the friction or pressure. This may involve: ? Changing your shoes. ? Wearing shoe inserts (orthotics) or other protective layers in your shoes, such as a corn pad. ? Wearing gloves.  Applying medicine to the skin (topical medicine) to help soften skin in the hardened, thickened areas.  Removing layers of dead skin with a file to reduce the size of the corn or callus.  Removing the corn or callus with a scalpel or laser.  Taking antibiotic medicines, if your corn or callus is infected.  Having surgery, if a toe deformity is the cause. Follow these instructions at home:   Take over-the-counter and prescription medicines only as told by your health care provider.  If you were prescribed an antibiotic, take it as told by your health care provider. Do not stop taking it even if your condition starts to improve.  Wear shoes that fit well. Avoid wearing high-heeled shoes and shoes that are too tight or too loose.  Wear any padding, protective layers, gloves, or orthotics as told by your health care provider.  Soak your hands or feet and then use a file or pumice stone to soften your corn or callus. Do this as told by your health care provider.  Check your corn or callus every day for symptoms of infection. Contact a health care provider if you:  Notice that your symptoms do not improve with treatment.  Have redness or swelling that gets worse.  Notice that your corn or callus becomes painful.  Have fluid, blood, or pus coming from your corn or callus.  Have new symptoms. Summary  Corns are small areas of  thickened skin that occur on the top, sides, or tip of a toe.  Calluses are areas of thickened skin that can occur anywhere on the body, including the hands, fingers, palms, and soles of the feet. Calluses are usually larger than corns.  Corns and calluses are caused by rubbing (friction) or pressure, such as from shoes that are too tight or do not fit properly.  Treatment may include wearing any padding, protective layers, gloves, or orthotics as told by your health care provider. This information is not intended to replace advice given to you by your health care provider. Make sure you discuss any questions you have with your health care provider. Document Revised: 02/02/2019 Document Reviewed: 08/26/2017 Elsevier Patient Education  [place]. Diabetes Mellitus and [place] care is an important part of your health, especially when you have diabetes. Diabetes may cause you to have problems because of poor blood flow (circulation) to  your feet and legs, which can cause your skin to:  Become thinner and drier.  Break more easily.  Heal more slowly.  Peel and crack. You may also have nerve damage (neuropathy) in your legs and feet, causing decreased feeling in them. This means that you may not notice minor injuries to your feet that could lead to more serious problems. Noticing and addressing any potential problems early is the best way to prevent future foot problems. How to care for your feet Foot hygiene  Wash your feet daily with warm water and mild soap. Do not use hot water. Then, pat your feet and the areas between your toes until they are completely dry. Do not soak your feet as this can dry your skin.  Trim your toenails straight across. Do not dig under them or around the cuticle. File the edges of your nails with an emery board or nail file.  Apply a moisturizing lotion or petroleum jelly to the skin on your feet and to dry, brittle toenails. Use lotion that does  not contain alcohol and is unscented. Do not apply lotion between your toes. Shoes and socks  Wear clean socks or stockings every day. Make sure they are not too tight. Do not wear knee-high stockings since they may decrease blood flow to your legs.  Wear shoes that fit properly and have enough cushioning. Always look in your shoes before you put them on to be sure there are no objects inside.  To break in new shoes, wear them for just a few hours a day. This prevents injuries on your feet. Wounds, scrapes, corns, and calluses  Check your feet daily for blisters, cuts, bruises, sores, and redness. If you cannot see the bottom of your feet, use a mirror or ask someone for help.  Do not cut corns or calluses or try to remove them with medicine.  If you find a minor scrape, cut, or break in the skin on your feet, keep it and the skin around it clean and dry. You may clean these areas with mild soap and water. Do not clean the area with peroxide, alcohol, or iodine.  If you have a wound, scrape, corn, or callus on your foot, look at it several times a day to make sure it is healing and not infected. Check for: ? Redness, swelling, or pain. ? Fluid or blood. ? Warmth. ? Pus or a bad smell. General instructions  Do not cross your legs. This may decrease blood flow to your feet.  Do not use heating pads or hot water bottles on your feet. They may burn your skin. If you have lost feeling in your feet or legs, you may not know this is happening until it is too late.  Protect your feet from hot and cold by wearing shoes, such as at the beach or on hot pavement.  Schedule a complete foot exam at least once a year (annually) or more often if you have foot problems. If you have foot problems, report any cuts, sores, or bruises to your health care provider immediately. Contact a health care provider if:  You have a medical condition that increases your risk of infection and you have any cuts,  sores, or bruises on your feet.  You have an injury that is not healing.  You have redness on your legs or feet.  You feel burning or tingling in your legs or feet.  You have pain or cramps in your legs and feet.  Your legs or feet are numb.  Your feet always feel cold.  You have pain around a toenail. Get help right away if:  You have a wound, scrape, corn, or callus on your foot and: ? You have pain, swelling, or redness that gets worse. ? You have fluid or blood coming from the wound, scrape, corn, or callus. ? Your wound, scrape, corn, or callus feels warm to the touch. ? You have pus or a bad smell coming from the wound, scrape, corn, or callus. ? You have a fever. ? You have a red line going up your leg. Summary  Check your feet every day for cuts, sores, red spots, swelling, and blisters.  Moisturize feet and legs daily.  Wear shoes that fit properly and have enough cushioning.  If you have foot problems, report any cuts, sores, or bruises to your health care provider immediately.  Schedule a complete foot exam at least once a year (annually) or more often if you have foot problems. This information is not intended to replace advice given to you by your health care provider. Make sure you discuss any questions you have with your health care provider. Document Revised: 07/06/2019 Document Reviewed: 11/14/2016 Elsevier Patient Education  [place].

## 2019-11-17 NOTE — Progress Notes (Signed)
Subjective: Manuel Romero is a 64 y.o. y.o. male with h/o diabetes who presents today for preventative diabetic foot care. Patient has painful, elongated mycotic toenails and calluses b/l which pose a risk and interfere with daily activities. Pain is aggravated when wearing enclosed shoe gear and relieved with periodic professional debridement.  He voices no new pedal concerns on today's visit.  [name], PA is patient's PCP.   Medications reviewed in chart.  No Known Allergies  Objective: There were no vitals filed for this visit.  Vascular Examination: Capillary refill time to digits <3 seconds b/l.  Dorsalis pedis 1/4 b/l.  Posterior tibial pulses 1/4 b/l.  Digital hair  present x 10 digits.  Skin temperature gradient WNL b/l.  Dermatological Examination: Skin with normal turgor, texture and tone b/l.  Skin changes consistent with chronic venous stasis changes b/l.  Toenails 1-5 b/l discolored, thick, dystrophic with subungual debris and pain with palpation to nailbeds due to thickness of nails.  Hyperkeratotic lesions plantar aspect left foot, submet head 1 right and b/l hallux. No erythema, no edema, no drainage, no flocculence noted.    Musculoskeletal: Muscle strength 5/5 to all LE muscle groups b/l.  Neurological: Sensation intact 5/5 b/l with 10 gram monofilament.  Vibratory sensation intact b/l.   Assessment: 1. Painful onychomycosis toenails 1-5 b/l 2.  Calluses plantar left foot,  submet head 1 right and b/l hallux 3.  NIDDM  Plan: 1. Continue diabetic foot care principles. Literature dispensed on today. 2. Toenails 1-5 b/l were debrided in length and girth without iatrogenic bleeding. 3. Hyperkeratotic lesion(s) plantar aspect left foot, submet head 1 right and b/l hallux pared with sterile scalpel blade without incident.  4. Patient to continue soft, supportive shoe gear daily. 5. Patient to report any pedal injuries to medical professional  immediately. 6. Follow up 3 months.  7. Patient/POA to call should there be a concern in the interim.

## 2019-12-09 DIAGNOSIS — C44612 Basal cell carcinoma of skin of right upper limb, including shoulder: Secondary | ICD-10-CM | POA: Diagnosis not present

## 2019-12-09 DIAGNOSIS — C44619 Basal cell carcinoma of skin of left upper limb, including shoulder: Secondary | ICD-10-CM | POA: Diagnosis not present

## 2019-12-09 DIAGNOSIS — L570 Actinic keratosis: Secondary | ICD-10-CM | POA: Diagnosis not present

## 2019-12-29 DIAGNOSIS — Z0000 Encounter for general adult medical examination without abnormal findings: Secondary | ICD-10-CM | POA: Diagnosis not present

## 2019-12-29 DIAGNOSIS — E1169 Type 2 diabetes mellitus with other specified complication: Secondary | ICD-10-CM | POA: Diagnosis not present

## 2019-12-29 DIAGNOSIS — Z125 Encounter for screening for malignant neoplasm of prostate: Secondary | ICD-10-CM | POA: Diagnosis not present

## 2019-12-29 DIAGNOSIS — E785 Hyperlipidemia, unspecified: Secondary | ICD-10-CM | POA: Diagnosis not present

## 2019-12-29 DIAGNOSIS — I10 Essential (primary) hypertension: Secondary | ICD-10-CM | POA: Diagnosis not present

## 2020-02-13 ENCOUNTER — Ambulatory Visit: Payer: BC Managed Care – PPO | Admitting: Podiatry

## 2020-02-13 ENCOUNTER — Encounter: Payer: Self-pay | Admitting: Podiatry

## 2020-02-13 ENCOUNTER — Other Ambulatory Visit: Payer: Self-pay

## 2020-02-13 DIAGNOSIS — M79674 Pain in right toe(s): Secondary | ICD-10-CM | POA: Diagnosis not present

## 2020-02-13 DIAGNOSIS — L84 Corns and callosities: Secondary | ICD-10-CM

## 2020-02-13 DIAGNOSIS — B351 Tinea unguium: Secondary | ICD-10-CM

## 2020-02-13 DIAGNOSIS — M79675 Pain in left toe(s): Secondary | ICD-10-CM | POA: Diagnosis not present

## 2020-02-13 DIAGNOSIS — E119 Type 2 diabetes mellitus without complications: Secondary | ICD-10-CM

## 2020-02-13 NOTE — Patient Instructions (Signed)
Diabetes Mellitus and Foot Care Foot care is an important part of your health, especially when you have diabetes. Diabetes may cause you to have problems because of poor blood flow (circulation) to your feet and legs, which can cause your skin to:  Become thinner and drier.  Break more easily.  Heal more slowly.  Peel and crack. You may also have nerve damage (neuropathy) in your legs and feet, causing decreased feeling in them. This means that you may not notice minor injuries to your feet that could lead to more serious problems. Noticing and addressing any potential problems early is the best way to prevent future foot problems. How to care for your feet Foot hygiene  Wash your feet daily with warm water and mild soap. Do not use hot water. Then, pat your feet and the areas between your toes until they are completely dry. Do not soak your feet as this can dry your skin.  Trim your toenails straight across. Do not dig under them or around the cuticle. File the edges of your nails with an emery board or nail file.  Apply a moisturizing lotion or petroleum jelly to the skin on your feet and to dry, brittle toenails. Use lotion that does not contain alcohol and is unscented. Do not apply lotion between your toes. Shoes and socks  Wear clean socks or stockings every day. Make sure they are not too tight. Do not wear knee-high stockings since they may decrease blood flow to your legs.  Wear shoes that fit properly and have enough cushioning. Always look in your shoes before you put them on to be sure there are no objects inside.  To break in new shoes, wear them for just a few hours a day. This prevents injuries on your feet. Wounds, scrapes, corns, and calluses  Check your feet daily for blisters, cuts, bruises, sores, and redness. If you cannot see the bottom of your feet, use a mirror or ask someone for help.  Do not cut corns or calluses or try to remove them with medicine.  If you  find a minor scrape, cut, or break in the skin on your feet, keep it and the skin around it clean and dry. You may clean these areas with mild soap and water. Do not clean the area with peroxide, alcohol, or iodine.  If you have a wound, scrape, corn, or callus on your foot, look at it several times a day to make sure it is healing and not infected. Check for: ? Redness, swelling, or pain. ? Fluid or blood. ? Warmth. ? Pus or a bad smell. General instructions  Do not cross your legs. This may decrease blood flow to your feet.  Do not use heating pads or hot water bottles on your feet. They may burn your skin. If you have lost feeling in your feet or legs, you may not know this is happening until it is too late.  Protect your feet from hot and cold by wearing shoes, such as at the beach or on hot pavement.  Schedule a complete foot exam at least once a year (annually) or more often if you have foot problems. If you have foot problems, report any cuts, sores, or bruises to your health care provider immediately. Contact a health care provider if:  You have a medical condition that increases your risk of infection and you have any cuts, sores, or bruises on your feet.  You have an injury that is not   healing.  You have redness on your legs or feet.  You feel burning or tingling in your legs or feet.  You have pain or cramps in your legs and feet.  Your legs or feet are numb.  Your feet always feel cold.  You have pain around a toenail. Get help right away if:  You have a wound, scrape, corn, or callus on your foot and: ? You have pain, swelling, or redness that gets worse. ? You have fluid or blood coming from the wound, scrape, corn, or callus. ? Your wound, scrape, corn, or callus feels warm to the touch. ? You have pus or a bad smell coming from the wound, scrape, corn, or callus. ? You have a fever. ? You have a red line going up your leg. Summary  Check your feet every day  for cuts, sores, red spots, swelling, and blisters.  Moisturize feet and legs daily.  Wear shoes that fit properly and have enough cushioning.  If you have foot problems, report any cuts, sores, or bruises to your health care provider immediately.  Schedule a complete foot exam at least once a year (annually) or more often if you have foot problems. This information is not intended to replace advice given to you by your health care provider. Make sure you discuss any questions you have with your health care provider. Document Revised: 07/06/2019 Document Reviewed: 11/14/2016 Elsevier Patient Education  2020 [employer]  Onychomycosis/Fungal Toenails  WHAT IS IT? An infection that lies within the keratin of your nail plate that is caused by a fungus.  WHY ME? Fungal infections affect all ages, sexes, races, and creeds.  There may be many factors that predispose you to a fungal infection such as age, coexisting medical conditions such as diabetes, or an autoimmune disease; stress, medications, fatigue, genetics, etc.  Bottom line: fungus thrives in a warm, moist environment and your shoes offer such a location.  IS IT CONTAGIOUS? Theoretically, yes.  You do not want to share shoes, nail clippers or files with someone who has fungal toenails.  Walking around barefoot in the same room or sleeping in the same bed is unlikely to transfer the organism.  It is important to realize, however, that fungus can spread easily from one nail to the next on the same foot.  HOW DO WE TREAT THIS?  There are several ways to treat this condition.  Treatment may depend on many factors such as age, medications, pregnancy, liver and kidney conditions, etc.  It is best to ask your doctor which options are available to you.  5. No treatment.   Unlike many other medical concerns, you can live with this condition.  However for many people this can be a painful condition and may lead to ingrown toenails or a bacterial  infection.  It is recommended that you keep the nails cut short to help reduce the amount of fungal nail. 6. Topical treatment.  These range from herbal remedies to prescription strength nail lacquers.  About 40-50% effective, topicals require twice daily application for approximately 9 to 12 months or until an entirely new nail has grown out.  The most effective topicals are medical grade medications available through physicians offices. 7. Oral antifungal medications.  With an 80-90% cure rate, the most common oral medication requires 3 to 4 months of therapy and stays in your system for a year as the new nail grows out.  Oral antifungal medications do require blood work to make   sure it is a safe drug for you.  A liver function panel will be performed prior to starting the medication and after the first month of treatment.  It is important to have the blood work performed to avoid any harmful side effects.  In general, this medication safe but blood work is required. 8. Laser Therapy.  This treatment is performed by applying a specialized laser to the affected nail plate.  This therapy is noninvasive, fast, and non-painful.  It is not covered by insurance and is therefore, out of pocket.  The results have been very good with a 80-95% cure rate.  [facility] is the only practice in the area to offer this therapy. 9. Permanent Nail Avulsion.  Removing the entire nail so that a new nail will not grow back. 

## 2020-02-16 NOTE — Progress Notes (Signed)
Subjective: Manuel Romero presents today for follow up of preventative diabetic foot care and callus(es) b/l feet and painful mycotic toenails b/l that are difficult to trim. Pain interferes with ambulation. Aggravating factors include wearing enclosed shoe gear. Pain is relieved with periodic professional debridement.   Mr. Meth voices no new pedal concerns on today's visit.   No Known Allergies   Objective: There were no vitals filed for this visit.  Pt 64 y.o. year old male  in NAD. AAO x 3.   Vascular Examination:  Capillary fill time to digits <3 seconds b/l. Faintly palpable DP pulses b/l. Faintly palpable PT pulses b/l. Pedal hair present b/l. Skin temperature gradient within normal limits b/l.  Dermatological Examination: Pedal skin with normal turgor, texture and tone bilaterally. No open wounds bilaterally. No interdigital macerations bilaterally. Toenails 1-5 b/l elongated, dystrophic, thickened, crumbly with subungual debris and tenderness to dorsal palpation. Hyperkeratotic lesion(s) plantar aspect left foot, L hallux, R hallux and submet head 1 left foot.  No erythema, no edema, no drainage, no flocculence. Evidence of chronic venous insufficiency b/l LE.  Musculoskeletal: Normal muscle strength 5/5 to all lower extremity muscle groups bilaterally, no pain crepitus or joint limitation noted with ROM b/l, bunion deformity noted b/l and patient ambulates independent of any assistive aids.  Neurological: Protective sensation intact 5/5 intact bilaterally with 10g monofilament b/l. Vibratory sensation intact b/l. Babinski reflex negative b/l. Clonus negative b/l.  Assessment: 1. Pain due to onychomycosis of toenails of both feet   2. Callus   3. Type 2 diabetes mellitus without complication, without long-term current use of insulin ([place])    Plan: -Continue diabetic foot care principles. Literature dispensed on today.  -Toenails 1-5 b/l were debrided in length and girth with  sterile nail nippers and dremel without iatrogenic bleeding.  -Callus(es) plantar aspect left foot, L hallux, R hallux and submet head 1 left foot were debrided without complication or incident. Total number debrided =4. -Patient to continue soft, supportive shoe gear daily. -Patient to report any pedal injuries to medical professional immediately. -Patient/POA to call should there be question/concern in the interim.  Return in about 3 months (around 05/14/2020) for diabetic nail and callus trim.

## 2020-05-14 ENCOUNTER — Ambulatory Visit: Payer: BC Managed Care – PPO | Admitting: Podiatry

## 2020-05-14 ENCOUNTER — Other Ambulatory Visit: Payer: Self-pay

## 2020-05-14 ENCOUNTER — Encounter: Payer: Self-pay | Admitting: Podiatry

## 2020-05-14 DIAGNOSIS — B351 Tinea unguium: Secondary | ICD-10-CM | POA: Diagnosis not present

## 2020-05-14 DIAGNOSIS — L84 Corns and callosities: Secondary | ICD-10-CM | POA: Diagnosis not present

## 2020-05-14 DIAGNOSIS — E1151 Type 2 diabetes mellitus with diabetic peripheral angiopathy without gangrene: Secondary | ICD-10-CM

## 2020-05-14 DIAGNOSIS — M79674 Pain in right toe(s): Secondary | ICD-10-CM | POA: Diagnosis not present

## 2020-05-14 DIAGNOSIS — M79675 Pain in left toe(s): Secondary | ICD-10-CM | POA: Diagnosis not present

## 2020-05-17 NOTE — Progress Notes (Signed)
Subjective: Manuel Romero presents today for follow up of preventative diabetic foot care and callus(es) b/l feet and painful mycotic toenails b/l that are difficult to trim. Pain interferes with ambulation. Aggravating factors include wearing enclosed shoe gear. Pain is relieved with periodic professional debridement.  Manuel Romero states he has been taking apple cider vinegar gummies and notices improvement in his skin and feet. He voices no new pedal concerns on today's visit.   No Known Allergies   Objective: There were no vitals filed for this visit.  Manuel Romero is a pleasant 64 y.o. year old Caucasian male WD, WN in NAD. AAO x 3.   Vascular Examination:  Capillary fill time to digits <3 seconds b/l. Faintly palpable DP pulses b/l. Faintly palpable PT pulses b/l. Pedal hair present b/l. Skin temperature gradient within normal limits b/l.  Dermatological Examination: Pedal skin with normal turgor, texture and tone bilaterally. No open wounds bilaterally. No interdigital macerations bilaterally. Toenails 1-5 b/l elongated, discolored, dystrophic, thickened, crumbly with subungual debris and tenderness to dorsal palpation. Hyperkeratotic lesion(s) L hallux, L 2nd toe, R hallux, submet head 1 right foot, submet head 2 left foot and submet head 2 right foot.  No erythema, no edema, no drainage, no flocculence. Evidence of chronic venous insufficiency b/l LE.  Musculoskeletal: Normal muscle strength 5/5 to all lower extremity muscle groups bilaterally, no pain crepitus or joint limitation noted with ROM b/l, bunion deformity noted b/l and patient ambulates independent of any assistive aids.  Neurological: Protective sensation intact 5/5 intact bilaterally with 10g monofilament b/l. Vibratory sensation intact b/l. Babinski reflex negative b/l. Clonus negative b/l.  Assessment: 1. Pain due to onychomycosis of toenails of both feet   2. Callus   3. Type II diabetes mellitus with peripheral  circulatory disorder (HCC)    Plan: -Continue diabetic foot care principles. -Toenails 1-5 b/l were debrided in length and girth with sterile nail nippers and dremel without iatrogenic bleeding.  -Callus(es) L hallux, L 2nd toe, R hallux, submet head 1 right foot, submet head 2 left foot and submet head 2 right foot pared utilizing sterile scalpel blade without complication or incident. Total number debrided =6. -Patient to report any pedal injuries to medical professional immediately. -Patient to continue soft, supportive shoe gear daily. -Patient/POA to call should there be question/concern in the interim.  Return in about 3 months (around 08/14/2020) for diabetic nail and callus trim.

## 2020-08-03 DIAGNOSIS — I10 Essential (primary) hypertension: Secondary | ICD-10-CM | POA: Diagnosis not present

## 2020-08-03 DIAGNOSIS — E1169 Type 2 diabetes mellitus with other specified complication: Secondary | ICD-10-CM | POA: Diagnosis not present

## 2020-08-03 DIAGNOSIS — Z23 Encounter for immunization: Secondary | ICD-10-CM | POA: Diagnosis not present

## 2020-08-03 DIAGNOSIS — E7800 Pure hypercholesterolemia, unspecified: Secondary | ICD-10-CM | POA: Diagnosis not present

## 2020-08-14 ENCOUNTER — Ambulatory Visit (INDEPENDENT_AMBULATORY_CARE_PROVIDER_SITE_OTHER): Payer: BC Managed Care – PPO | Admitting: Podiatry

## 2020-08-14 ENCOUNTER — Encounter: Payer: Self-pay | Admitting: Podiatry

## 2020-08-14 ENCOUNTER — Other Ambulatory Visit: Payer: Self-pay

## 2020-08-14 DIAGNOSIS — B351 Tinea unguium: Secondary | ICD-10-CM

## 2020-08-14 DIAGNOSIS — L84 Corns and callosities: Secondary | ICD-10-CM

## 2020-08-14 DIAGNOSIS — M79674 Pain in right toe(s): Secondary | ICD-10-CM

## 2020-08-14 DIAGNOSIS — E1151 Type 2 diabetes mellitus with diabetic peripheral angiopathy without gangrene: Secondary | ICD-10-CM

## 2020-08-14 DIAGNOSIS — M79675 Pain in left toe(s): Secondary | ICD-10-CM

## 2020-08-14 DIAGNOSIS — I872 Venous insufficiency (chronic) (peripheral): Secondary | ICD-10-CM

## 2020-08-14 NOTE — Patient Instructions (Signed)
Mix Foot Miracle Cream and Vaseline Petroleum Jelly and apply to both feet daily. Do not apply between toes.

## 2020-08-19 NOTE — Progress Notes (Signed)
Subjective: Manuel Romero presents today for follow up of preventative diabetic foot care and callus(es) b/l feet and painful mycotic toenails b/l that are difficult to trim. Pain interferes with ambulation. Aggravating factors include wearing enclosed shoe gear. Pain is relieved with periodic professional debridement.   [name] voices no new pedal problems on today's visit.  No Known Allergies   Objective: There were no vitals filed for this visit.  [name] is a pleasant 64 y.o. year old Caucasian male WD, WN in NAD. AAO x 3.   Vascular Examination:  Capillary fill time to digits <3 seconds b/l lower extremities. Faintly palpable DP pulse(s) b/l lower extremities. Faintly palpable PT pulse(s) b/l lower extremities. Pedal hair present. Lower extremity skin temperature gradient within normal limits. Nonpitting edema noted b/l lower extremities. Varicosities present b/l. Evidence of chronic venous insufficiency b/l LE.  Dermatological Examination: Pedal skin with normal turgor, texture and tone bilaterally. No open wounds bilaterally. No interdigital macerations bilaterally. Toenails 1-5 b/l elongated, discolored, dystrophic, thickened, crumbly with subungual debris and tenderness to dorsal palpation. Hyperkeratotic lesion(s) L hallux, L 2nd toe, R hallux, submet head 1 right foot, submet head 2 left foot and submet head 2 right foot.  No erythema, no edema, no drainage, no flocculence. Evidence of chronic venous insufficiency b/l LE.  Musculoskeletal: Normal muscle strength 5/5 to all lower extremity muscle groups bilaterally, no pain crepitus or joint limitation noted with ROM b/l, bunion deformity noted b/l and patient ambulates independent of any assistive aids.  Neurological: Protective sensation intact 5/5 intact bilaterally with 10g monofilament b/l. Vibratory sensation intact b/l. Babinski reflex negative b/l. Clonus negative b/l.  Assessment: 1. Pain due to onychomycosis of  toenails of both feet   2. Callus   3. Chronic venous insufficiency of lower extremity   4. Type II diabetes mellitus with peripheral circulatory disorder (HCC)    Plan: -Continue diabetic foot care principles. -Toenails 1-5 b/l were debrided in length and girth with sterile nail nippers and dremel without iatrogenic bleeding.  -Callus(es) L hallux, L 2nd toe, R hallux, submet head 1 right foot, submet head 2 left foot and submet head 2 right foot pared utilizing sterile scalpel blade without complication or incident. Total number debrided =6. -Patient to report any pedal injuries to medical professional immediately. -Patient to continue soft, supportive shoe gear daily. -Patient/POA to call should there be question/concern in the interim.  Return in about 3 months (around 11/14/2020).

## 2020-08-20 IMAGING — US US SCROTUM W/ DOPPLER COMPLETE
1 series · 14 of 25 positions shown · non-contrast
Comparison: None.

CLINICAL DATA: Testicular mass.

EXAM:
SCROTAL ULTRASOUND
DOPPLER ULTRASOUND OF THE TESTICLES
TECHNIQUE: Complete ultrasound examination of the testicles, epididymis, and
other scrotal structures was performed. Color and spectral Doppler
ultrasound were also utilized to evaluate blood flow to the
testicles.

[Series 1: us scrotum w/ doppler complete · 0.07mm/px · 14 of 85 slices shown]
[im 1/85]
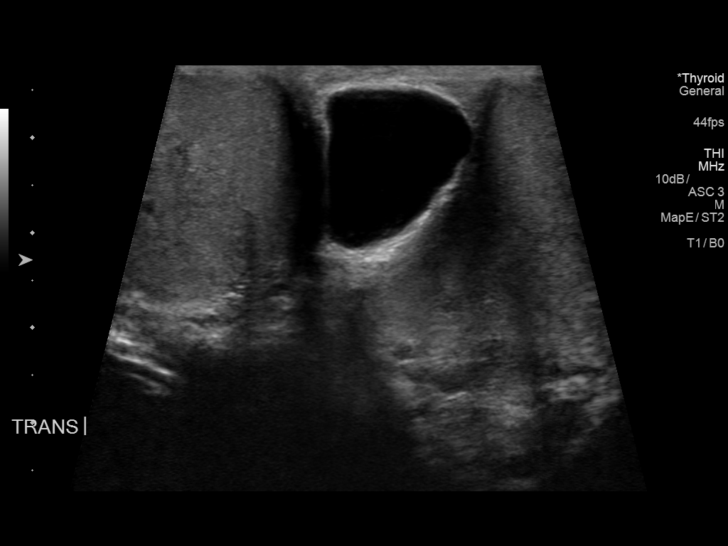
[im 8/85]
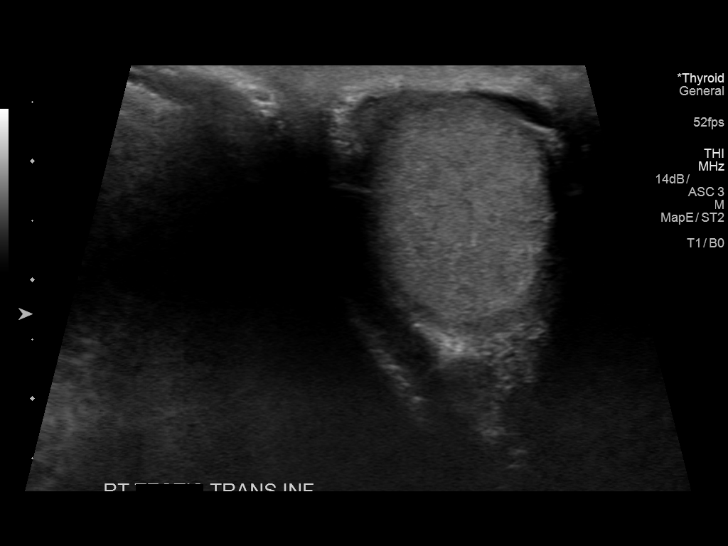
[im 15/85]
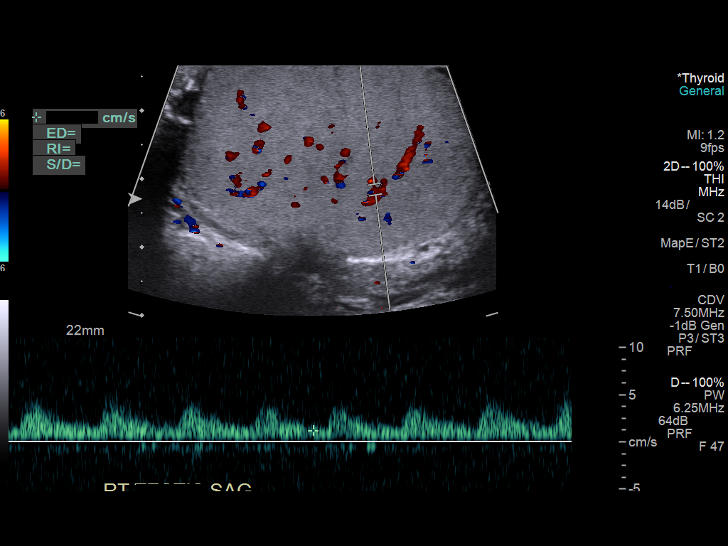
[im 22/85]
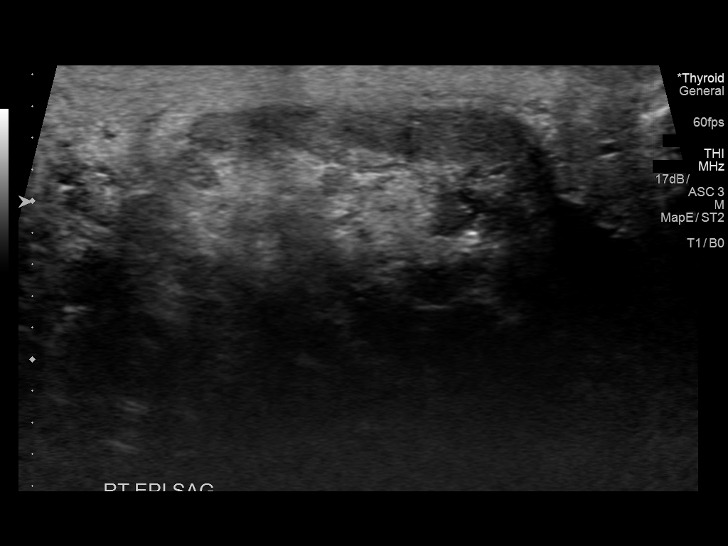
[im 29/85]
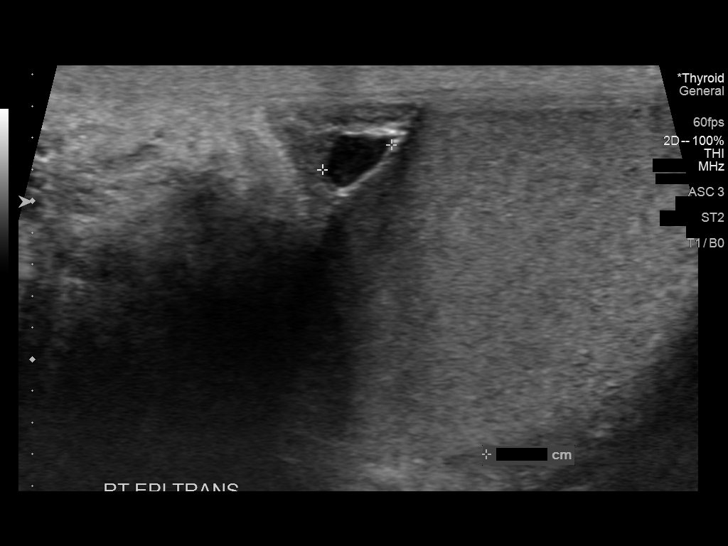
[im 32/85]
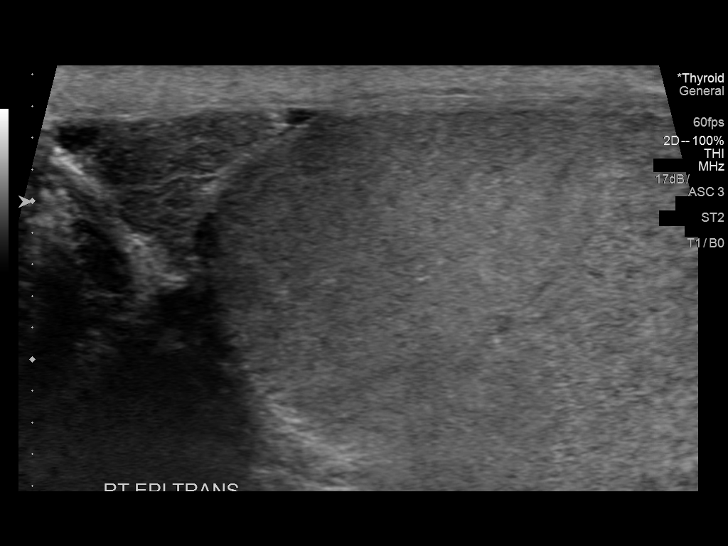
[im 39/85]
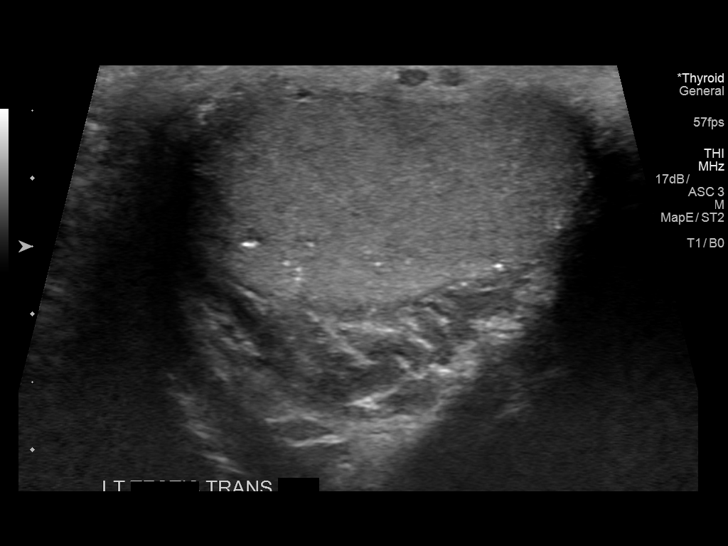
[im 46/85]
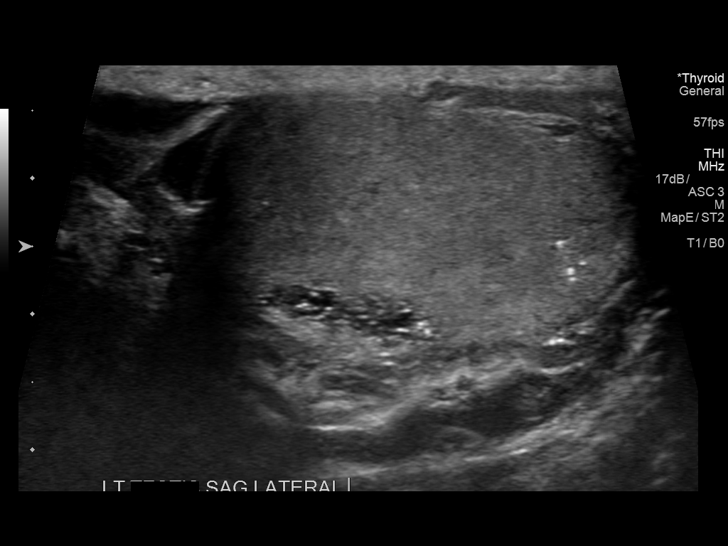
[im 53/85]
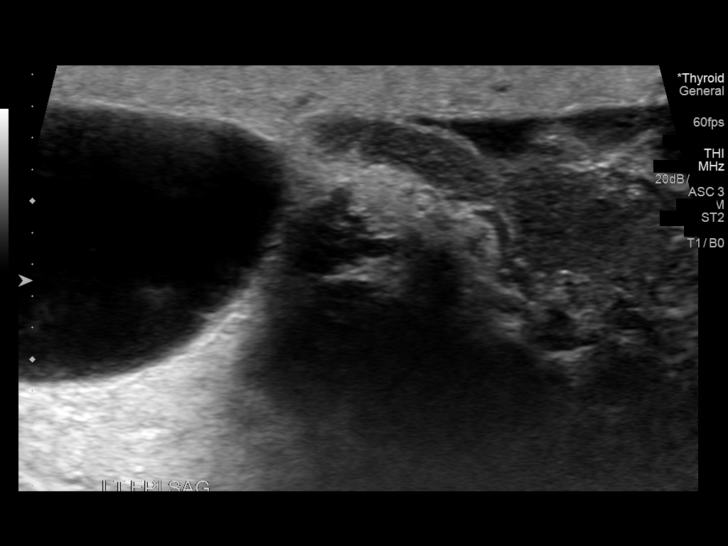
[im 57/85]
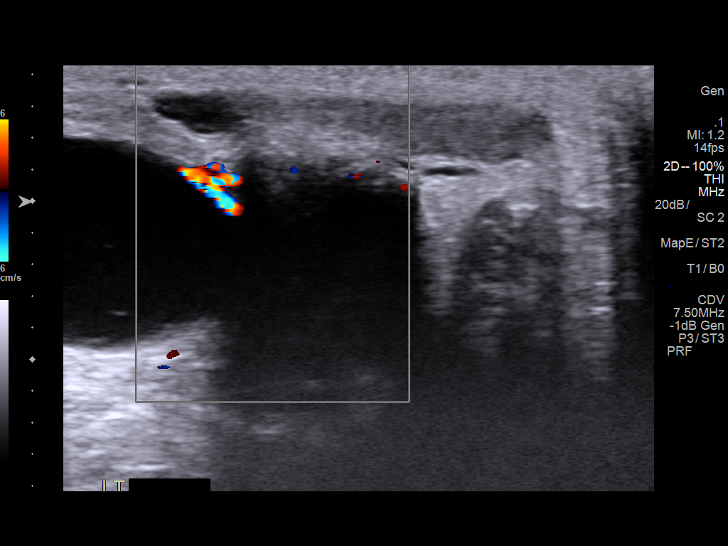
[im 64/85]
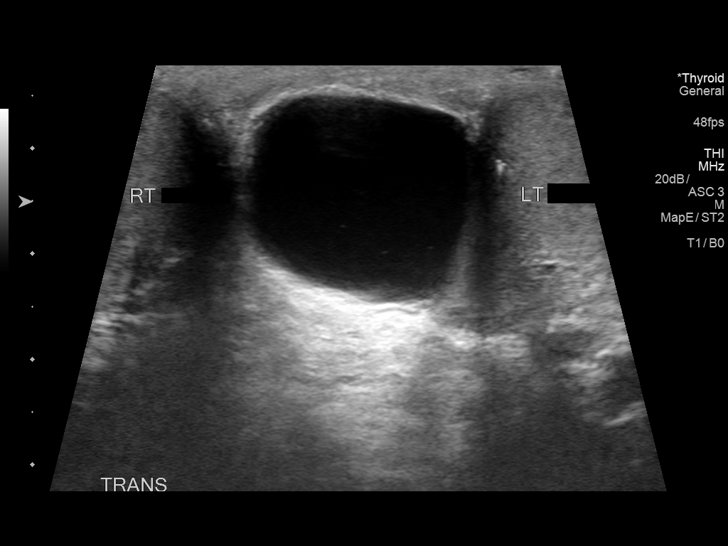
[im 71/85]
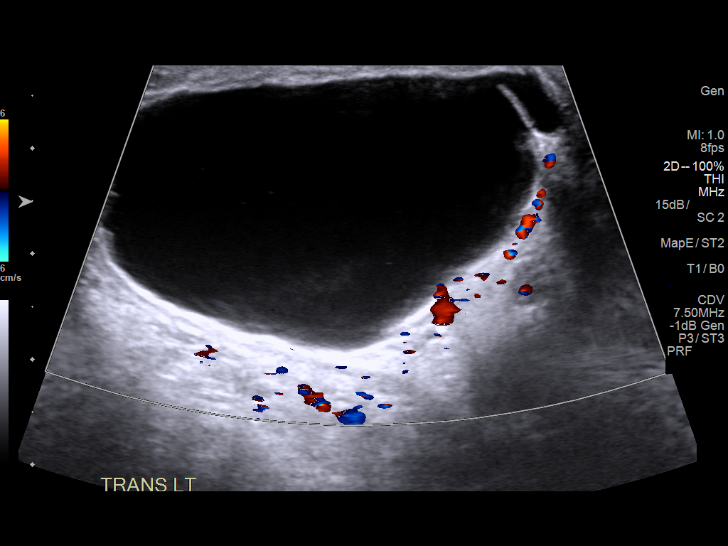
[im 78/85]
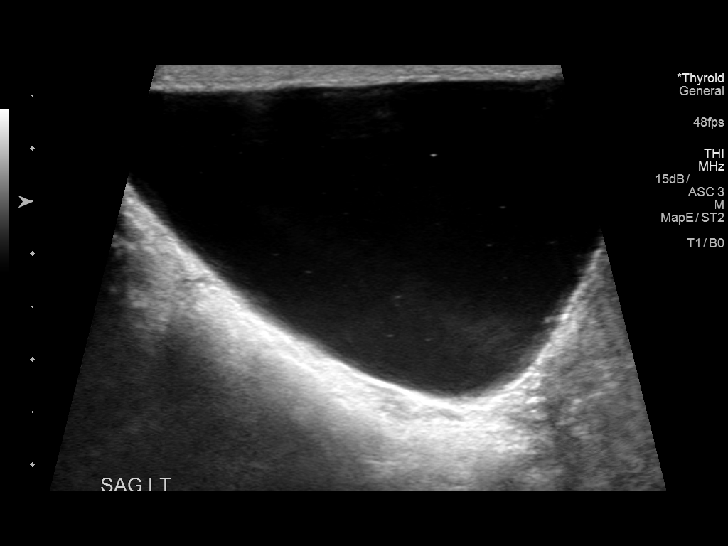
[im 85/85]
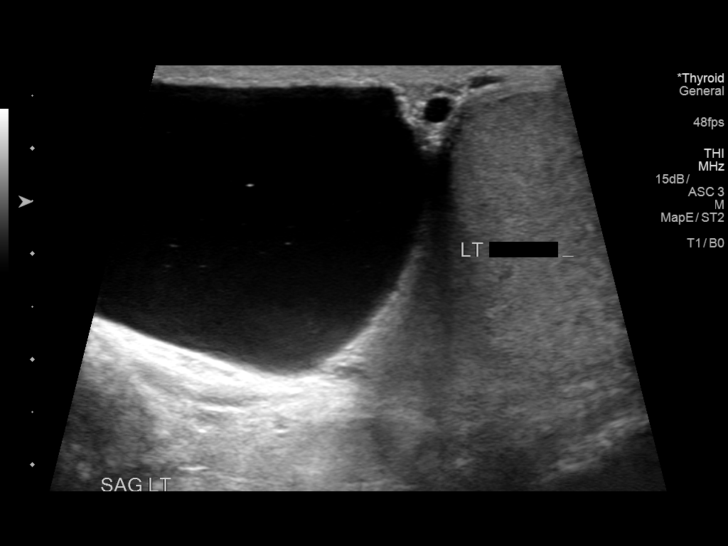

[14 of 25 positions shown; findings below may reference images not displayed]

FINDINGS: Right testicle

Measurements: 4.5 x 2.8 x 2.6 cm. No mass or microlithiasis
visualized.

Left testicle

Measurements: 3.9 x 2.4 x 3.2 cm. No mass or microlithiasis
visualized.

Right epididymis:  Subcentimeter cyst noted, otherwise normal.

Left epididymis: Subcentimeter cysts noted, otherwise normal. There
is a 4.6 by 2.8 by 4.6 cm complicated cyst within the medial left
scrotum.

Hydrocele:  Small bilateral hydrocele.

Varicocele:  None visualized.

Pulsed Doppler interrogation of both testes demonstrates normal low
resistance arterial and venous waveforms bilaterally.
IMPRESSION: Normal appearance of the testicles.

Large, 4.6 cm complicated cyst within the medial left scrotum may
represent a large epididymal cyst or a complicated hydrocele, and
likely accounts for the area of palpable concern.

## 2020-11-12 ENCOUNTER — Ambulatory Visit: Payer: BC Managed Care – PPO | Admitting: Podiatry

## 2020-11-13 DIAGNOSIS — Z20822 Contact with and (suspected) exposure to covid-19: Secondary | ICD-10-CM | POA: Diagnosis not present

## 2020-11-16 ENCOUNTER — Ambulatory Visit: Payer: BC Managed Care – PPO | Admitting: Podiatry

## 2020-11-21 ENCOUNTER — Encounter: Payer: Self-pay | Admitting: Podiatry

## 2020-11-21 ENCOUNTER — Other Ambulatory Visit: Payer: Self-pay

## 2020-11-21 ENCOUNTER — Ambulatory Visit (INDEPENDENT_AMBULATORY_CARE_PROVIDER_SITE_OTHER): Payer: BC Managed Care – PPO | Admitting: Podiatry

## 2020-11-21 DIAGNOSIS — L84 Corns and callosities: Secondary | ICD-10-CM | POA: Diagnosis not present

## 2020-11-21 DIAGNOSIS — M79674 Pain in right toe(s): Secondary | ICD-10-CM

## 2020-11-21 DIAGNOSIS — E1151 Type 2 diabetes mellitus with diabetic peripheral angiopathy without gangrene: Secondary | ICD-10-CM

## 2020-11-21 DIAGNOSIS — B351 Tinea unguium: Secondary | ICD-10-CM | POA: Diagnosis not present

## 2020-11-21 DIAGNOSIS — I83013 Varicose veins of right lower extremity with ulcer of ankle: Secondary | ICD-10-CM | POA: Diagnosis not present

## 2020-11-21 DIAGNOSIS — M79675 Pain in left toe(s): Secondary | ICD-10-CM

## 2020-11-21 DIAGNOSIS — L97311 Non-pressure chronic ulcer of right ankle limited to breakdown of skin: Secondary | ICD-10-CM | POA: Diagnosis not present

## 2020-11-21 NOTE — Patient Instructions (Signed)
Follow up with Cardiology regarding venous insufficiency, varicose veins of right lower extremity.   Chronic Venous Insufficiency Chronic venous insufficiency is a condition where the leg veins cannot effectively pump blood from the legs to the heart. This happens when the vein walls are either stretched, weakened, or damaged, or when the valves inside the vein are damaged. With the right treatment, you should be able to continue with an active life. This condition is also called venous stasis. What are the causes? Common causes of this condition include:  High blood pressure inside the veins (venous hypertension).  Sitting or standing too long, causing increased blood pressure in the leg veins.  A blood clot that blocks blood flow in a vein (deep vein thrombosis, DVT).  Inflammation of a vein (phlebitis) that causes a blood clot to form.  Tumors in the pelvis that cause blood to back up. What increases the risk? The following factors may make you more likely to develop this condition:  Having a family history of this condition.  Obesity.  Pregnancy.  Living without enough regular physical activity or exercise (sedentary lifestyle).  Smoking.  Having a job that requires long periods of standing or sitting in one place.  Being a certain age. Women in their 33s and 3s and men in their 4s are more likely to develop this condition. What are the signs or symptoms? Symptoms of this condition include:  Veins that are enlarged, bulging, or twisted (varicose veins).  Skin breakdown or ulcers.  Reddened skin or dark discoloration of skin on the leg between the knee and ankle.  Tinner, smooth, tight, and painful skin just above the ankle, usually on the inside of the leg (lipodermatosclerosis).  Swelling of the legs. How is this diagnosed? This condition may be diagnosed based on:  Your medical history.  A physical exam.  Tests, such as: ? A procedure that creates an image  of a blood vessel and nearby organs and provides information about blood flow through the blood vessel (duplex ultrasound). ? A procedure that tests blood flow (plethysmography). ? A procedure that looks at the veins using X-ray and dye (venogram). How is this treated? The goals of treatment are to help you return to an active life and to minimize pain or disability. Treatment depends on the severity of your condition, and it may include:  Wearing compression stockings. These can help relieve symptoms and help prevent your condition from getting worse. However, they do not cure the condition.  Sclerotherapy. This procedure involves an injection of a solution that shrinks damaged veins.  Surgery. This may involve: ? Removing a diseased vein (vein stripping). ? Cutting off blood flow through the vein (laser ablation surgery). ? Repairing or reconstructing a valve within the affected vein.   Follow these instructions at home:  Wear compression stockings as told by your health care provider. These stockings help to prevent blood clots and reduce swelling in your legs.  Take over-the-counter and prescription medicines only as told by your health care provider.  Stay active by exercising, walking, or doing different activities. Ask your health care provider what activities are safe for you and how much exercise you need.  Drink enough fluid to keep your urine pale yellow.  Do not use any products that contain nicotine or tobacco, such as cigarettes, e-cigarettes, and chewing tobacco. If you need help quitting, ask your health care provider.  Keep all follow-up visits as told by your health care provider. This is important.  Contact a health care provider if you:  Have redness, swelling, or more pain in the affected area.  See a red streak or line that goes up or down from the affected area.  Have skin breakdown or skin loss in the affected area, even if the breakdown is small.  Get  an injury in the affected area. Get help right away if:  You get an injury and an open wound in the affected area.  You have: ? Severe pain that does not get better with medicine. ? Sudden numbness or weakness in the foot or ankle below the affected area. ? Trouble moving your foot or ankle. ? A fever. ? Worse or persistent symptoms. ? Chest pain. ? Shortness of breath. Summary  Chronic venous insufficiency is a condition where the leg veins cannot effectively pump blood from the legs to the heart.  Chronic venous insufficiency occurs when the vein walls become stretched, weakened, or damaged, or when valves within the vein are damaged.  Treatment depends on how severe your condition is. It often involves wearing compression stockings and may involve having a procedure.  Make sure you stay active by exercising, walking, or doing different activities. Ask your health care provider what activities are safe for you and how much exercise you need. This information is not intended to replace advice given to you by your health care provider. Make sure you discuss any questions you have with your health care provider. Document Revised: 07/06/2018 Document Reviewed: 07/06/2018 Elsevier Patient Education  2021 [employer].

## 2020-11-25 NOTE — Progress Notes (Signed)
Subjective: Manuel Romero presents today for follow up of preventative diabetic foot care and callus(es) b/l feet and painful mycotic toenails b/l that are difficult to trim. Pain interferes with ambulation. Aggravating factors include wearing enclosed shoe gear. Pain is relieved with periodic professional debridement.   Patient has recently lost his brother and aunt within the past several weeks.   He states his right leg swells when he sits for long periods of time. He has known venous disease. States he has been referred to Cardiologist, but has not made appointment yet. He states he has small area on right ankle. He has been applying Neosporin to it daily.  No Known Allergies   Objective: There were no vitals filed for this visit.  Manuel Romero is a pleasant 65 y.o. year old Caucasian male WD, WN in NAD. AAO x 3.   Vascular Examination:  Capillary fill time to digits <3 seconds b/l lower extremities. Faintly palpable DP pulse(s) b/l lower extremities. Faintly palpable PT pulse(s) b/l lower extremities. Pedal hair present. Lower extremity skin temperature gradient within normal limits. Nonpitting edema noted b/l lower extremities. Varicosities present b/l. Evidence of chronic venous insufficiency b/l LE.  Dermatological Examination: Pedal skin with normal turgor, texture and tone bilaterally. No open wounds bilaterally. No interdigital macerations bilaterally. Toenails 1-5 b/l elongated, discolored, dystrophic, thickened, crumbly with subungual debris and tenderness to dorsal palpation. Hyperkeratotic lesion(s) L hallux, L 2nd toe, R hallux, submet head 1 right foot, submet head 2 left foot and submet head 2 right foot.  No erythema, no edema, no drainage, no flocculence. Evidence of chronic venous insufficiency b/l LE.   Superficial ulceration medial aspect of ankle of RLE. No surrounding erythema, no drainage, no odor. Measures 0.2 x 0.2 x 0.1 cm.  Musculoskeletal: Normal muscle strength  5/5 to all lower extremity muscle groups bilaterally, no pain crepitus or joint limitation noted with ROM b/l, bunion deformity noted b/l and patient ambulates independent of any assistive aids.  Neurological: Protective sensation intact 5/5 intact bilaterally with 10g monofilament b/l. Vibratory sensation intact b/l. Babinski reflex negative b/l. Clonus negative b/l.  Assessment: 1. Pain due to onychomycosis of toenails of both feet   2. Callus   3. Venous stasis ulcer of right ankle limited to breakdown of skin with varicose veins ([place])   4. Type II diabetes mellitus with peripheral circulatory disorder (HCC)    Plan: -Continue diabetic foot care principles. -Advised patient to see Cardiologist regarding venous insufficiency of RLE. Continue local wound care to right ankle once daily.  He related understanding. -Toenails 1-5 b/l were debrided in length and girth with sterile nail nippers and dremel without iatrogenic bleeding.  -Callus(es) L hallux, L 2nd toe, R hallux, submet head 1 right foot, submet head 2 left foot and submet head 2 right foot pared utilizing sterile scalpel blade without complication or incident. Total number debrided =6. -Patient to report any pedal injuries to medical professional immediately. -Patient to continue soft, supportive shoe gear daily. -Patient/POA to call should there be question/concern in the interim.  Return in about 3 months (around 02/19/2021).

## 2021-01-01 DIAGNOSIS — E7800 Pure hypercholesterolemia, unspecified: Secondary | ICD-10-CM | POA: Diagnosis not present

## 2021-01-01 DIAGNOSIS — Z125 Encounter for screening for malignant neoplasm of prostate: Secondary | ICD-10-CM | POA: Diagnosis not present

## 2021-01-01 DIAGNOSIS — I10 Essential (primary) hypertension: Secondary | ICD-10-CM | POA: Diagnosis not present

## 2021-01-01 DIAGNOSIS — Z1211 Encounter for screening for malignant neoplasm of colon: Secondary | ICD-10-CM | POA: Diagnosis not present

## 2021-01-01 DIAGNOSIS — E669 Obesity, unspecified: Secondary | ICD-10-CM | POA: Diagnosis not present

## 2021-01-01 DIAGNOSIS — Z0000 Encounter for general adult medical examination without abnormal findings: Secondary | ICD-10-CM | POA: Diagnosis not present

## 2021-01-01 DIAGNOSIS — E1169 Type 2 diabetes mellitus with other specified complication: Secondary | ICD-10-CM | POA: Diagnosis not present

## 2021-02-06 DIAGNOSIS — C4362 Malignant melanoma of left upper limb, including shoulder: Secondary | ICD-10-CM | POA: Diagnosis not present

## 2021-02-06 DIAGNOSIS — C4441 Basal cell carcinoma of skin of scalp and neck: Secondary | ICD-10-CM | POA: Diagnosis not present

## 2021-02-06 DIAGNOSIS — D485 Neoplasm of uncertain behavior of skin: Secondary | ICD-10-CM | POA: Diagnosis not present

## 2021-02-15 ENCOUNTER — Other Ambulatory Visit: Payer: Self-pay | Admitting: *Deleted

## 2021-02-15 DIAGNOSIS — I83893 Varicose veins of bilateral lower extremities with other complications: Secondary | ICD-10-CM

## 2021-02-19 ENCOUNTER — Ambulatory Visit (HOSPITAL_COMMUNITY): Payer: Self-pay

## 2021-03-04 ENCOUNTER — Other Ambulatory Visit: Payer: Self-pay

## 2021-03-04 ENCOUNTER — Ambulatory Visit (INDEPENDENT_AMBULATORY_CARE_PROVIDER_SITE_OTHER): Payer: BC Managed Care – PPO | Admitting: Podiatry

## 2021-03-04 ENCOUNTER — Encounter: Payer: Self-pay | Admitting: Podiatry

## 2021-03-04 DIAGNOSIS — M79675 Pain in left toe(s): Secondary | ICD-10-CM

## 2021-03-04 DIAGNOSIS — L84 Corns and callosities: Secondary | ICD-10-CM | POA: Diagnosis not present

## 2021-03-04 DIAGNOSIS — M79674 Pain in right toe(s): Secondary | ICD-10-CM

## 2021-03-04 DIAGNOSIS — M2012 Hallux valgus (acquired), left foot: Secondary | ICD-10-CM | POA: Diagnosis not present

## 2021-03-04 DIAGNOSIS — M2011 Hallux valgus (acquired), right foot: Secondary | ICD-10-CM | POA: Diagnosis not present

## 2021-03-04 DIAGNOSIS — B351 Tinea unguium: Secondary | ICD-10-CM | POA: Diagnosis not present

## 2021-03-04 DIAGNOSIS — E1151 Type 2 diabetes mellitus with diabetic peripheral angiopathy without gangrene: Secondary | ICD-10-CM

## 2021-03-10 NOTE — Progress Notes (Signed)
Subjective:  Patient ID: Manuel Romero, male    DOB: October 05, 1956,  MRN: [id number]  65 y.o. male presents with at risk foot care. Pt has h/o NIDDM with PAD and callus(es) b/l feet and painful thick toenails that are difficult to trim. Painful toenails interfere with ambulation. Aggravating factors include wearing enclosed shoe gear. Pain is relieved with periodic professional debridement. Painful calluses are aggravated when weightbearing with and without shoegear. Pain is relieved with periodic professional debridement.   He states he has developed a new lesion on the outside of his right 4th toe. He feels as if the lesion is between the toes. Denies any redness, drainage or swelling of digit.  He is now seeing Vascular for his chronic venous insufficiency/LE edema. He wears compression hose daily now.  Patient did not check blood glucose this morning. He is out of test strips.  PCP: [name], PA and last visit was: 01/01/2021.  Review of Systems: Negative except as noted in the HPI.   Allergies  Allergen Reactions  . Chromium     Other reaction(s): dizziness    Objective:  There were no vitals filed for this visit. Constitutional Patient is a pleasant 65 y.o. Caucasian male WD, WN in NAD. AAO x 3.  Vascular Capillary fill time to digits <3 seconds b/l lower extremities. Faintly palpable pedal pulses b/l. Pedal hair absent. Lower extremity skin temperature gradient within normal limits. No pain with calf compression b/l. Unilateral edema right LE. Evidence of chronic venous insufficiency right lower extremity. No cyanosis or clubbing noted.  Neurologic Normal speech. Protective sensation intact 5/5 intact bilaterally with 10g monofilament b/l. Vibratory sensation intact b/l.  Dermatologic Pedal skin with normal turgor, texture and tone bilaterally. No open wounds bilaterally. No interdigital macerations bilaterally. Toenails 1-5 b/l elongated, discolored, dystrophic, thickened,  crumbly with subungual debris and tenderness to dorsal palpation. Hyperkeratotic lesion(s) L hallux, L 2nd toe, R hallux, R 4th toe, submet head 1 right foot, submet head 2 left foot and submet head 2 right foot.  No erythema, no edema, no drainage, no fluctuance.  Orthopedic: Normal muscle strength 5/5 to all lower extremity muscle groups bilaterally. No pain crepitus or joint limitation noted with ROM b/l. Hallux valgus with bunion deformity noted b/l lower extremities.      Assessment:   1. Pain due to onychomycosis of toenails of both feet   2. Corns and callosities   3. Hallux valgus, acquired, bilateral   4. Type II diabetes mellitus with peripheral circulatory disorder [facility])    Plan:  Patient was evaluated and treated and all questions answered.  Onychomycosis with pain -Nails palliatively debridement as below. -Educated on self-care  Procedure: Nail Debridement Rationale: Pain Type of Debridement: manual, sharp debridement. Instrumentation: Nail nipper, rotary burr. Number of Nails: 10  -Examined patient. -Continue diabetic foot care principles. -Patient to continue soft, supportive shoe gear daily. -Toenails 1-5 b/l were debrided in length and girth with sterile nail nippers and dremel without iatrogenic bleeding.  -Callus(es) L hallux, L 2nd toe, R hallux, R 4th toe, submet head 1 right foot, submet head 2 left foot and submet head 2 right foot pared utilizing sterile scalpel blade without complication or incident. Total number debrided =7. -Patient to report any pedal injuries to medical professional immediately. -After paring of right 4th digit, pinpoint bleeding addressed with Lumicain Hemostatic Solution. Triple antibiotic ointment band-aid applied.No further treatment required by patient.  Dispensed foam toe separator for right 4th webspace. Apply every  morning. Remove every evening. -Patient/POA to call should there be question/concern in the interim.  Return in about  3 months (around 06/04/2021).  Marzetta Board, DPM

## 2021-03-11 ENCOUNTER — Encounter (HOSPITAL_COMMUNITY): Payer: Self-pay

## 2021-03-15 ENCOUNTER — Other Ambulatory Visit: Payer: Self-pay | Admitting: General Surgery

## 2021-03-15 DIAGNOSIS — C4362 Malignant melanoma of left upper limb, including shoulder: Secondary | ICD-10-CM

## 2021-03-20 ENCOUNTER — Encounter: Payer: Self-pay | Admitting: Physician Assistant

## 2021-03-20 ENCOUNTER — Other Ambulatory Visit: Payer: Self-pay

## 2021-03-20 ENCOUNTER — Ambulatory Visit (HOSPITAL_COMMUNITY)
Admission: RE | Admit: 2021-03-20 | Discharge: 2021-03-20 | Disposition: A | Discharge status: Home / Self Care | Payer: BC Managed Care – PPO | Source: Ambulatory Visit | Attending: Vascular Surgery | Admitting: Vascular Surgery

## 2021-03-20 ENCOUNTER — Ambulatory Visit (INDEPENDENT_AMBULATORY_CARE_PROVIDER_SITE_OTHER): Payer: BC Managed Care – PPO | Admitting: Physician Assistant

## 2021-03-20 VITALS — BP 148/67 | HR 56 | Temp 98.4°F | Resp 20 | Ht 76.0 in | Wt 238.3 lb

## 2021-03-20 DIAGNOSIS — E785 Hyperlipidemia, unspecified: Secondary | ICD-10-CM | POA: Insufficient documentation

## 2021-03-20 DIAGNOSIS — E7800 Pure hypercholesterolemia, unspecified: Secondary | ICD-10-CM | POA: Insufficient documentation

## 2021-03-20 DIAGNOSIS — I83893 Varicose veins of bilateral lower extremities with other complications: Secondary | ICD-10-CM

## 2021-03-20 DIAGNOSIS — J309 Allergic rhinitis, unspecified: Secondary | ICD-10-CM | POA: Insufficient documentation

## 2021-03-20 DIAGNOSIS — I8393 Asymptomatic varicose veins of bilateral lower extremities: Secondary | ICD-10-CM

## 2021-03-20 DIAGNOSIS — I872 Venous insufficiency (chronic) (peripheral): Secondary | ICD-10-CM

## 2021-03-20 DIAGNOSIS — E669 Obesity, unspecified: Secondary | ICD-10-CM | POA: Insufficient documentation

## 2021-03-20 NOTE — Progress Notes (Signed)
Requested by:  [name], [place],  [place] [id number]  Reason for consultation: varicose veins    History of Present Illness   Manuel Romero is a 65 y.o. ([birth year]) male who presents for evaluation of varicose veins in right leg. He states that he first started noticing the large veins in his legs several years ago. 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 He says the darkening in his legs ans gradually appeared over the years and he has had local trauma to his right leg from various things outside doing yard work. He reports fatigue in his legs after a long day but otherwise no aching, throbbing, burning itching, bleeding or ulceration. He does have swelling which is worsened at the end of the day. This is improved with elevation and he recently started wearing 20-30 mmHg knee high compression stockings at recommendation by his Podiatrist. He says his legs have dramatically improved in appearance and swelling with wearing the compression socks. He has no history of DVT. He does have extensive history of venous disease in his family  Venous symptoms include: (aching, heavy, tired, throbbing, burning, itching, swelling, bleeding, ulcer)   Onset/duration: > 5 years Occupation:  Landscaping Aggravating factors: (sitting, standing) Alleviating factors: (elevation) Compression:  Yes 20-30 knee high Helps:  yes Pain medications: none Previous vein procedures: none History of DVT: no  Past Medical History:  Diagnosis Date  . HTN (hypertension)   . Type 2 diabetes mellitus (HCC)     No past surgical history on file.  Social History   Socioeconomic History  . Marital status: Married    Spouse name: Not on file  . Number of children: Not on file  . Years of education: Not on file  . Highest education level: Not on file  Occupational History  . Not on file  Tobacco Use  . Smoking status: Never  Smoker  . Smokeless tobacco: Never Used  Substance and Sexual Activity  . Alcohol use: Not on file  . Drug use: Not on file  . Sexual activity: Not on file  Other Topics Concern  . Not on file  Social History Narrative  . Not on file   Social Determinants of Health   Financial Resource Strain: Not on file  Food Insecurity: Not on file  Transportation Needs: Not on file  Physical Activity: Not on file  Stress: Not on file  Social Connections: Not on file  Intimate Partner Violence: Not on file    Family History  Problem Relation Age of Onset  . Diabetes Brother   . Neuropathy Brother     Current Outpatient Medications  Medication Sig Dispense Refill  . acetaminophen (TYLENOL) 500 MG tablet 1 tablet as needed    . Apple Cider Vinegar 300 MG TABS See admin instructions.    [name] aspirin 81 MG EC tablet 1 tablet    . atorvastatin (LIPITOR) 20 MG tablet 1 tablet    . BESIVANCE 0.6 % SUSP INSTILL 1 DROP INTO RIGHT EYE THREE TIMES DAILY  1  . DUREZOL 0.05 % EMUL INSTILL 1 DROP INTO RIGHT EYE THREE TIMES DAILY AS DIRECTED  1  . fluorouracil (EFUDEX) 5 % cream APPLY CREAM TOPICALLY ON THE SKIN TO ARMS TWICE DAILY    . metFORMIN (GLUCOPHAGE) 1000 MG tablet Take 1 tablet by mouth 2 (two) times daily with a meal.    .  Multiple Vitamin (MULTIVITAMINS PO) See admin instructions.    . Multiple Vitamins-Minerals (AIRBORNE) TBEF See admin instructions.    . mupirocin ointment (BACTROBAN) 2 % APPLY ON THE SKIN TWICE DAILY(APPLY TO THE WOUND WITH DRESSING CHANGES)    . NON FORMULARY [facility]  Onychomycosis Nail Lacquer -  Fluconazole 2%, Terbinafine 1% DMSO Apply to affected nail once daily Qty. 120 gm 3 refills    . NONFORMULARY OR COMPOUNDED ITEM [facility]:  Onychomycosis Nail Lacquer - Fluconazole 2%, Terbinafine 1%, DMSO, apply to affected area daily. 120 each 11  . PROLENSA 0.07 % SOLN INSTILL 1 DROP INTO RIGHT EYE AT BEDTIME AS DIRECTED  1  .  valsartan-hydrochlorothiazide (DIOVAN-HCT) 80-12.5 MG tablet 1 tablet     No current facility-administered medications for this visit.    Allergies  Allergen Reactions  . Chromium     Other reaction(s): dizziness    REVIEW OF SYSTEMS (negative unless checked):   Cardiac:  []  Chest pain or chest pressure? []  Shortness of breath upon activity? []  Shortness of breath when lying flat? []  Irregular heart rhythm?  Vascular:  []  Pain in calf, thigh, or hip brought on by walking? []  Pain in feet at night that wakes you up from your sleep? []  Blood clot in your veins? [x]  Leg swelling?  Pulmonary:  []  Oxygen at home? []  Productive cough? []  Wheezing?  Neurologic:  []  Sudden weakness in arms or legs? []  Sudden numbness in arms or legs? []  Sudden onset of difficult speaking or slurred speech? []  Temporary loss of vision in one eye? []  Problems with dizziness?  Gastrointestinal:  []  Blood in stool? []  Vomited blood?  Genitourinary:  []  Burning when urinating? []  Blood in urine?  Psychiatric:  []  Major depression  Hematologic:  []  Bleeding problems? []  Problems with blood clotting?  Dermatologic:  []  Rashes or ulcers?  Constitutional:  []  Fever or chills?  Ear/Nose/Throat:  []  Change in hearing? []  Nose bleeds? []  Sore throat?  Musculoskeletal:  []  Back pain? []  Joint pain? []  Muscle pain?   Physical Examination     Vitals:   03/20/21 1105  BP: (!) 148/67  Pulse: (!) 56  Resp: 20  Temp: 98.4 F (36.9 C)  TempSrc: Temporal  SpO2: 98%  Weight: 238 lb 4.8 oz (108.1 kg)  Height: 6\' 4"  (1.93 m)   Body mass index is 29.01 kg/m.  General:  WDWN in NAD; vital signs documented above Gait: Normal HENT: WNL, normocephalic Pulmonary: normal non-labored breathing , without wheezing Cardiac: regular HR, without  Murmurs without carotid bruit Vascular Exam/Pulses: 2+ radial pulses, 2+ femoral, popliteal, DP and PT pulses bilaterally. Feet warm and well  perfused Extremities: with varicose veins, with reticular veins, without edema, with stasis pigmentation, with lipodermatosclerosis, without ulcers      Large ropey varicose vein on medial aspect of distal thigh extending over medial knee to anterior right leg Musculoskeletal: no muscle wasting or atrophy  Neurologic: A&O X 3;  No focal weakness or paresthesias are detected Psychiatric:  The pt has Normal affect.  Non-invasive Vascular Imaging   BLE Venous Insufficiency Duplex (03/20/21):   RLE:   DVT and SVT  GSV reflux SFJ to proximal calf  GSV diameter 0.31 -0.69  SSV reflux  CFV and FV deep venous reflux   Medical Decision Making   TOREN TUCHOLSKI is a 65 y.o. male who presents with: RLE chronic venous insufficiency, with large varicose veins of the medial right thigh/ knee. Duplex shows  no DVT or SVT. He does have Deep and superficial venous reflux. The GSV is of adequate size in the thigh to be considered for a venous ablation. He additionally has a large varicose vein that potentially he could have a stab phlebectomy for if he continues to have issues with it in the future. He has been wearing 20-54mmHg knee high compression and elevating his legs daily. I have encouraged him to continue this. I discussed with the patient the use of her 20-30 mm thigh high compression stockings and need for 3 month trial of such. He was measured for the thigh high compression stockings at today's visit. We also discussed proper elevation technique and refraining from prolonged standing or ambulation.   The patient will follow up in 3 months with Dr. Scot Dock.  Thank you for allowing [place] to participate in this patient's care.   Karoline Caldwell, PA-C [facility] Office: [phone]  03/20/2021, 11:09 AM  Clinic MD: Scot Dock

## 2021-03-26 NOTE — Progress Notes (Addendum)
Surgical Instructions    Your procedure is scheduled on Thursday June 9th.  Report to [facility] Main Entrance "A" at 11 A.M., then check in with the Admitting office.  Call this number if you have problems the morning of surgery:  [phone]   If you have any questions prior to your surgery date call [phone]: Open Monday-Friday 8am-4pm    Remember:  Do not eat after midnight the night before your surgery   Enhanced Recovery after Surgery  Enhanced Recovery after Surgery is a protocol used to improve the stress on your body and your recovery after surgery.  Patient Instructions  . The day of surgery (if you have diabetes):  o Drink ONE small 10 oz bottle of water by __10___ am the morning of surgery o This bottle was given to you during your hospital  pre-op appointment visit.  o Nothing else to drink after completing the  Small 10 oz bottle of water.         If you have questions, please contact your surgeon's office.  You may drink clear liquids until 10am the morning of your surgery.   Clear liquids allowed are: Water, Non-Citrus Juices (without pulp), Carbonated Beverages, Clear Tea, Black Coffee Only, and Gatorade    Take these medicines the morning of surgery with A SIP OF WATER  atorvastatin (LIPITOR) 20 MG tablet   WHAT DO I DO ABOUT MY DIABETES MEDICATION?   [name] Do not take oral diabetes medicines (Metformin) the morning of surgery.   HOW TO MANAGE YOUR DIABETES BEFORE AND AFTER SURGERY  Why is it important to control my blood sugar before and after surgery? . Improving blood sugar levels before and after surgery helps healing and can limit problems. . A way of improving blood sugar control is eating a healthy diet by: o  Eating less sugar and carbohydrates o  Increasing activity/exercise o  Talking with your doctor about reaching your blood sugar goals . High blood sugars (greater than 180 mg/dL) can raise your risk of infections and slow your  recovery, so you will need to focus on controlling your diabetes during the weeks before surgery. . Make sure that the doctor who takes care of your diabetes knows about your planned surgery including the date and location.  How do I manage my blood sugar before surgery? . Check your blood sugar at least 4 times a day, starting 2 days before surgery, to make sure that the level is not too high or low.  . Check your blood sugar the morning of your surgery when you wake up and every 2 hours until you get to the Short Stay unit.  o If your blood sugar is less than 70 mg/dL, you will need to treat for low blood sugar: - Do not take insulin. - Treat a low blood sugar (less than 70 mg/dL) with  cup of clear juice (cranberry or apple), 4 glucose tablets, OR glucose gel. - Recheck blood sugar in 15 minutes after treatment (to make sure it is greater than 70 mg/dL). If your blood sugar is not greater than 70 mg/dL on recheck, call [phone] for further instructions. . Report your blood sugar to the short stay nurse when you get to Short Stay.  . If you are admitted to the hospital after surgery: o Your blood sugar will be checked by the staff and you will probably be given insulin after surgery (instead of oral diabetes medicines) to make sure you have good blood  sugar levels. o The goal for blood sugar control after surgery is 80-180 mg/dL.   As of today, STOP taking any Aspirin (unless otherwise instructed by your surgeon) Aleve, Naproxen, Ibuprofen, Motrin, Advil, Goody's, BC's, all herbal medications, fish oil, and all vitamins.                     Do not wear jewelry            Do not wear lotions, powders, colognes, or deodorant.            Do not shave 48 hours prior to surgery.  Men may shave face and neck.            Do not bring valuables to the hospital.            [facility] is not responsible for any belongings or valuables.  Do NOT Smoke (Tobacco/Vaping) or drink Alcohol 24 hours  prior to your procedure If you use a CPAP at night, you may bring all equipment for your overnight stay.   Contacts, glasses, dentures or bridgework may not be worn into surgery, please bring cases for these belongings   For patients admitted to the hospital, discharge time will be determined by your treatment team.   Patients discharged the day of surgery will not be allowed to drive home, and someone needs to stay with them for 24 hours.    Special instructions:    Oral Hygiene is also important to reduce your risk of infection.  Remember - BRUSH YOUR TEETH THE MORNING OF SURGERY WITH YOUR REGULAR TOOTHPASTE   [facility]- Preparing For Surgery  Before surgery, you can play an important role. Because skin is not sterile, your skin needs to be as free of germs as possible. You can reduce the number of germs on your skin by washing with CHG (chlorahexidine gluconate) Soap before surgery.  CHG is an antiseptic cleaner which kills germs and bonds with the skin to continue killing germs even after washing.     Please do not use if you have an allergy to CHG or antibacterial soaps. If your skin becomes reddened/irritated stop using the CHG.  Do not shave (including legs and underarms) for at least 48 hours prior to first CHG shower. It is OK to shave your face.  Please follow these instructions carefully.    1.  Shower the NIGHT BEFORE SURGERY and the MORNING OF SURGERY with CHG Soap.   If you chose to wash your hair, wash your hair first as usual with your normal shampoo. After you shampoo, rinse your hair and body thoroughly to remove the shampoo.  Then [employer] and genitals (private parts) with your normal soap and rinse thoroughly to remove soap.  2. After that Use CHG Soap as you would any other liquid soap. You can apply CHG directly to the skin and wash gently with a scrungie or a clean washcloth.   3. Apply the CHG Soap to your body ONLY FROM THE NECK DOWN.  Do not use on open  wounds or open sores. Avoid contact with your eyes, ears, mouth and genitals (private parts). Wash Face and genitals (private parts)  with your normal soap.   4. Wash thoroughly, paying special attention to the area where your surgery will be performed.  5. Thoroughly rinse your body with warm water from the neck down.  6. DO NOT shower/wash with your normal soap after using and rinsing off the  CHG Soap.  7. Pat yourself dry with a CLEAN TOWEL.  8. Wear CLEAN PAJAMAS to bed the night before surgery  9. Place CLEAN SHEETS on your bed the night before your surgery  10. DO NOT SLEEP WITH PETS.   Day of Surgery:  Take a shower with CHG soap. Wear Clean/Comfortable clothing the morning of surgery Do not apply any deodorants/lotions.   Remember to brush your teeth WITH YOUR REGULAR TOOTHPASTE.   Please read over the following fact sheets that you were given.

## 2021-03-27 ENCOUNTER — Other Ambulatory Visit: Payer: Self-pay

## 2021-03-27 ENCOUNTER — Encounter (HOSPITAL_COMMUNITY)
Admission: RE | Admit: 2021-03-27 | Discharge: 2021-03-27 | Disposition: A | Discharge status: Home / Self Care | Payer: BC Managed Care – PPO | Source: Ambulatory Visit | Attending: General Surgery | Admitting: General Surgery

## 2021-03-27 ENCOUNTER — Encounter (HOSPITAL_COMMUNITY): Payer: Self-pay

## 2021-03-27 DIAGNOSIS — Z01818 Encounter for other preprocedural examination: Secondary | ICD-10-CM | POA: Diagnosis not present

## 2021-03-27 HISTORY — DX: Basal cell carcinoma of skin, unspecified: C44.91

## 2021-03-27 LAB — CBC
HCT: 45.6 % (ref 39.0–52.0)
Hemoglobin: 15 g/dL (ref 13.0–17.0)
MCH: 31.6 pg (ref 26.0–34.0)
MCHC: 32.9 g/dL (ref 30.0–36.0)
MCV: 96 fL (ref 80.0–100.0)
Platelets: 250 10*3/uL (ref 150–400)
RBC: 4.75 MIL/uL (ref 4.22–5.81)
RDW: 13 % (ref 11.5–15.5)
WBC: 8.3 10*3/uL (ref 4.0–10.5)
nRBC: 0 % (ref 0.0–0.2)

## 2021-03-27 LAB — BASIC METABOLIC PANEL
Anion gap: 10 (ref 5–15)
BUN: 21 mg/dL (ref 8–23)
CO2: 24 mmol/L (ref 22–32)
Calcium: 9.6 mg/dL (ref 8.9–10.3)
Chloride: 105 mmol/L (ref 98–111)
Creatinine, Ser: 1.3 mg/dL — ABNORMAL HIGH (ref 0.61–1.24)
GFR, Estimated: >60 mL/min (ref >60)
Glucose, Bld: 96 mg/dL (ref 70–99)
Potassium: 4.1 mmol/L (ref 3.5–5.1)
Sodium: 139 mmol/L (ref 135–145)

## 2021-03-27 LAB — GLUCOSE, CAPILLARY: Glucose-Capillary: 142 mg/dL — ABNORMAL HIGH (ref 70–99)

## 2021-03-27 NOTE — Progress Notes (Signed)
PCP - Marilynne Drivers, PA Cardiologist - Denies  Chest x-ray - Not indicated EKG - 03/27/21 Stress Test - Denies ECHO - Denies Cardiac Cath - Yes when he was 65 yo, no follow up needed "clean"  Sleep Study - Yes has OSA does not require CPAP  DM - Yes type II Fasting Blood Sugar - 115-125 CBG at PAT appt 142 Checks Blood Sugar __2___ times a day  Blood Thinner Instructions: No Aspirin Instructions:No  ERAS Protcol - Yes PRE-SURGERY water   COVID TEST- Not indicated  Anesthesia review: No  Patient denies shortness of breath, fever, cough and chest pain at PAT appointment   All instructions explained to the patient, with a verbal understanding of the material. Patient agrees to go over the instructions while at home for a better understanding. The opportunity to ask questions was provided.

## 2021-03-28 LAB — HEMOGLOBIN A1C
Hgb A1c MFr Bld: 6.2 % — ABNORMAL HIGH (ref 4.8–5.6)
Mean Plasma Glucose: 131 mg/dL

## 2021-04-01 NOTE — H&P (Signed)
Baron Hamper Appointment: 03/15/2021 9:45 AM Location: [facility] Patient #: [id number] DOB: [birth year] Married / Language: Cleophus Molt / Race: White Male   History of Present Illness Stark Klein MD; 03/15/2021 10:35 AM) The patient is a 65 year old male who presents with malignant melanoma. Pt is a very nice 65 yo M who presents with dx left forearm melanoma 01/2021. He is referred by consultation by Dr. Fontaine No. He had a "freckle" there that started changing. He states that it first started looking like a "nailhead," then got larger wtih a Pottinger spot in the middle, and then developed some clearish fluid on top that "just made me nervous." He had a prior basal cell carcinoma and was established with dermatology. He then had a biopsy that was positive for malignant melanoma, nodular, 1.9 mm with involved deep margin, 8 mit/mm2, absent LVI, satellitosis, neurotropism, and ulceration, no regression, and brisk TIL.   pathology is [employer] # [phone]   Past Surgical History Janeann Forehand, CNA; 03/15/2021 9:54 AM) Appendectomy  Cataract Surgery  Bilateral. Oral Surgery   Diagnostic Studies History Janeann Forehand, CNA; 03/15/2021 9:54 AM) Colonoscopy  never  Allergies Janeann Forehand, CNA; 03/15/2021 9:55 AM) No Known Drug Allergies  [03/15/2021]: Allergies Reconciled   Medication History Janeann Forehand, CNA; 03/15/2021 9:55 AM) metFORMIN HCl (1000MG  Tablet, Oral) Active. Valsartan-hydroCHLOROthiazide (80-12.5MG  Tablet, Oral) Active. Medications Reconciled  Social History Janeann Forehand, CNA; 03/15/2021 9:54 AM) Alcohol use  Remotely quit alcohol use. No caffeine use  No drug use  Tobacco use  Never smoker.  Family History Janeann Forehand, CNA; 03/15/2021 9:54 AM) Alcohol Abuse  Brother. Arthritis  Daughter, Sister. Cancer  Father, Mother, Sister. Colon Polyps  Mother. Diabetes Mellitus  Brother, Father. Heart Disease   Brother, Father. Heart disease in male family member before age 75  Hypertension  Brother, Daughter, Father. Ischemic Bowel Disease  Daughter. Kidney Disease  Brother. Migraine Headache  Daughter.  Other Problems Janeann Forehand, CNA; 03/15/2021 9:54 AM) Alcohol Abuse  Diabetes Mellitus  Gastric Ulcer  Hemorrhoids  High blood pressure  Hypercholesterolemia  Melanoma  Sleep Apnea     Review of Systems Adriana Reams Alston CNA; 03/15/2021 9:54 AM) General Not Present- Appetite Loss, Chills, Fatigue, Fever, Night Sweats, Weight Gain and Weight Loss. Skin Not Present- Change in Wart/Mole, Dryness, Hives, Jaundice, New Lesions, Non-Healing Wounds, Rash and Ulcer. HEENT Present- Hearing Loss, Seasonal Allergies and Wears glasses/contact lenses. Not Present- Earache, Hoarseness, Nose Bleed, Oral Ulcers, Ringing in the Ears, Sinus Pain, Sore Throat, Visual Disturbances and Yellow Eyes. Respiratory Present- Snoring. Not Present- Bloody sputum, Chronic Cough, Difficulty Breathing and Wheezing. Breast Not Present- Breast Mass, Breast Pain, Nipple Discharge and Skin Changes. Cardiovascular Present- Leg Cramps. Not Present- Chest Pain, Difficulty Breathing Lying Down, Palpitations, Rapid Heart Rate, Shortness of Breath and Swelling of Extremities. Gastrointestinal Present- Excessive gas. Not Present- Abdominal Pain, Bloating, Bloody Stool, Change in Bowel Habits, Chronic diarrhea, Constipation, Difficulty Swallowing, Gets full quickly at meals, Hemorrhoids, Indigestion, Nausea, Rectal Pain and Vomiting. Psychiatric Not Present- Anxiety, Bipolar, Change in Sleep Pattern, Depression, Fearful and Frequent crying. Endocrine Not Present- Cold Intolerance, Excessive Hunger, Hair Changes, Heat Intolerance, Hot flashes and New Diabetes. Hematology Not Present- Blood Thinners, Easy Bruising, Excessive bleeding, Gland problems, HIV and Persistent Infections.  Vitals Adriana Reams Alston CNA; 03/15/2021  9:55 AM) 03/15/2021 9:55 AM Weight: 234.5 lb Height: 74in Body Surface Area: 2.33 m Body Mass Index: 30.11 kg/m  Temp.: 98.58F  Pulse: 73 (Regular)  P.OX: 98% (Room  air) BP: 160/86(Sitting, Left Arm, Standard)       Physical Exam Stark Klein MD; 03/15/2021 10:36 AM) General Mental Status-Alert. General Appearance-Consistent with stated age. Hydration-Well hydrated. Voice-Normal.  Integumentary Note: reddish scar on left forearm around 9 mm in size. no LAD at epitrochlear or axillary locations.   Head and Neck Head-normocephalic, atraumatic with no lesions or palpable masses. Trachea-midline. Thyroid Gland Characteristics - normal size and consistency.  Eye Eyeball - Bilateral-Extraocular movements intact. Sclera/Conjunctiva - Bilateral-No scleral icterus.  Chest and Lung Exam Chest and lung exam reveals -quiet, even and easy respiratory effort with no use of accessory muscles and on auscultation, normal breath sounds, no adventitious sounds and normal vocal resonance. Inspection Chest Wall - Normal. Back - normal.  Cardiovascular Cardiovascular examination reveals -normal heart sounds, regular rate and rhythm with no murmurs and normal pedal pulses bilaterally.  Abdomen Inspection Inspection of the abdomen reveals - No Hernias. Palpation/Percussion Palpation and Percussion of the abdomen reveal - Soft, Non Tender, No Rebound tenderness, No Rigidity (guarding) and No hepatosplenomegaly. Auscultation Auscultation of the abdomen reveals - Bowel sounds normal.  Neurologic Neurologic evaluation reveals -alert and oriented x 3 with no impairment of recent or remote memory. Mental Status-Normal.  Musculoskeletal Global Assessment -Note: no gross deformities.  Normal Exam - Left-Upper Extremity Strength Normal and Lower Extremity Strength Normal. Normal Exam - Right-Upper Extremity Strength Normal and Lower Extremity  Strength Normal.  Lymphatic Head & Neck  General Head & Neck Lymphatics: Bilateral - Description - Normal. Axillary  General Axillary Region: Bilateral - Description - Normal. Tenderness - Non Tender. Femoral & Inguinal  Generalized Femoral & Inguinal Lymphatics: Bilateral - Description - No Generalized lymphadenopathy.    Assessment & Plan Stark Klein MD; 03/15/2021 10:38 AM) MELANOMA OF FOREARM, LEFT (C43.62) Impression: Pt will need wide local excision with advancement flap closure and sentinel lymph node biopsy and mapping.  I discussed surgery. I reviewed risks of wound issues, numbness, seroma, bleeding, and possible need for additional surgeries. I advised that if the wound broke down, he would need dressing changes. 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  I discussed that if his nodes are positive, he will need a PET scan, referral to oncology, and will be recommended to receive immunotherapy.  He understands and wishes to proceed. I will schedule this urgently. Current Plans Pt Education - Melanoma: skin cancer You are being scheduled for surgery- Our schedulers will call you.  You should hear from our office's scheduling department within 5 working days about the location, date, and time of surgery. We try to make accommodations for patient's preferences in scheduling surgery, but sometimes the OR schedule or the surgeon's schedule prevents [place] from making those accommodations.  If you have not heard from our office [phone]) in 5 working days, call the office and ask for your surgeon's nurse.  If you have other questions about your diagnosis, plan, or surgery, call the office and ask for your surgeon's nurse.

## 2021-04-04 ENCOUNTER — Ambulatory Visit (HOSPITAL_COMMUNITY): Payer: BC Managed Care – PPO | Admitting: Anesthesiology

## 2021-04-04 ENCOUNTER — Other Ambulatory Visit: Payer: Self-pay

## 2021-04-04 ENCOUNTER — Ambulatory Visit (HOSPITAL_COMMUNITY)
Admission: RE | Admit: 2021-04-04 | Discharge: 2021-04-04 | Disposition: A | Discharge status: Home / Self Care | Payer: BC Managed Care – PPO | Source: Ambulatory Visit | Attending: General Surgery | Admitting: General Surgery

## 2021-04-04 ENCOUNTER — Encounter (HOSPITAL_COMMUNITY)
Admission: RE | Disposition: A | Discharge status: Home / Self Care | Payer: Self-pay | Source: Home / Self Care | Attending: General Surgery

## 2021-04-04 ENCOUNTER — Ambulatory Visit (HOSPITAL_COMMUNITY)
Admission: RE | Admit: 2021-04-04 | Discharge: 2021-04-04 | Disposition: A | Discharge status: Home / Self Care | Payer: BC Managed Care – PPO | Attending: General Surgery | Admitting: General Surgery

## 2021-04-04 ENCOUNTER — Encounter (HOSPITAL_COMMUNITY): Payer: Self-pay | Admitting: General Surgery

## 2021-04-04 DIAGNOSIS — Z79899 Other long term (current) drug therapy: Secondary | ICD-10-CM | POA: Diagnosis not present

## 2021-04-04 DIAGNOSIS — R590 Localized enlarged lymph nodes: Secondary | ICD-10-CM | POA: Diagnosis not present

## 2021-04-04 DIAGNOSIS — G8918 Other acute postprocedural pain: Secondary | ICD-10-CM | POA: Diagnosis not present

## 2021-04-04 DIAGNOSIS — Z85828 Personal history of other malignant neoplasm of skin: Secondary | ICD-10-CM | POA: Insufficient documentation

## 2021-04-04 DIAGNOSIS — Z7984 Long term (current) use of oral hypoglycemic drugs: Secondary | ICD-10-CM | POA: Diagnosis not present

## 2021-04-04 DIAGNOSIS — C4362 Malignant melanoma of left upper limb, including shoulder: Secondary | ICD-10-CM

## 2021-04-04 DIAGNOSIS — R599 Enlarged lymph nodes, unspecified: Secondary | ICD-10-CM | POA: Diagnosis not present

## 2021-04-04 DIAGNOSIS — E7800 Pure hypercholesterolemia, unspecified: Secondary | ICD-10-CM | POA: Diagnosis not present

## 2021-04-04 HISTORY — PX: EXCISION MELANOMA WITH SENTINEL LYMPH NODE BIOPSY: SHX5628

## 2021-04-04 LAB — GLUCOSE, CAPILLARY
Glucose-Capillary: 116 mg/dL — ABNORMAL HIGH (ref 70–99)
Glucose-Capillary: 127 mg/dL — ABNORMAL HIGH (ref 70–99)

## 2021-04-04 SURGERY — EXCISION, MELANOMA, WITH SENTINEL LYMPH NODE BIOPSY
Anesthesia: Regional

## 2021-04-04 MED ORDER — ONDANSETRON HCL 4 MG/2ML IJ SOLN
INTRAMUSCULAR | Status: AC
  Filled 2021-04-04: qty 2

## 2021-04-04 MED ORDER — BUPIVACAINE-EPINEPHRINE (PF) 0.5% -1:200000 IJ SOLN
INTRAMUSCULAR | Status: DC | PRN
  Administered 2021-04-04: 30 mL via PERINEURAL

## 2021-04-04 MED ORDER — MIDAZOLAM HCL 2 MG/2ML IJ SOLN: 2.0000 mg | Freq: Once | INTRAMUSCULAR | Status: AC

## 2021-04-04 MED ORDER — LIDOCAINE HCL 1 % IJ SOLN
INTRAMUSCULAR | Status: AC
  Filled 2021-04-04: qty 20

## 2021-04-04 MED ORDER — FENTANYL CITRATE (PF) 250 MCG/5ML IJ SOLN
INTRAMUSCULAR | Status: DC | PRN
  Administered 2021-04-04 (×3): 25 ug via INTRAVENOUS
  Administered 2021-04-04: 50 ug via INTRAVENOUS
  Administered 2021-04-04: 25 ug via INTRAVENOUS

## 2021-04-04 MED ORDER — CHLORHEXIDINE GLUCONATE 0.12 % MT SOLN
15.0000 mL | Freq: Once | OROMUCOSAL | Status: AC
  Administered 2021-04-04: 15 mL via OROMUCOSAL
  Filled 2021-04-04: qty 15

## 2021-04-04 MED ORDER — METHYLENE BLUE 0.5 % INJ SOLN
INTRAVENOUS | Status: AC
  Filled 2021-04-04: qty 10

## 2021-04-04 MED ORDER — FENTANYL CITRATE (PF) 100 MCG/2ML IJ SOLN: 25.0000 ug | INTRAMUSCULAR | Status: DC | PRN

## 2021-04-04 MED ORDER — ONDANSETRON HCL 4 MG/2ML IJ SOLN
INTRAMUSCULAR | Status: DC | PRN
  Administered 2021-04-04: 4 mg via INTRAVENOUS

## 2021-04-04 MED ORDER — ACETAMINOPHEN 500 MG PO TABS: 1000.0000 mg | ORAL_TABLET | Freq: Once | ORAL | Status: DC | PRN

## 2021-04-04 MED ORDER — FENTANYL CITRATE (PF) 100 MCG/2ML IJ SOLN
INTRAMUSCULAR | Status: AC
  Administered 2021-04-04: 50 ug via INTRAVENOUS
  Filled 2021-04-04: qty 2

## 2021-04-04 MED ORDER — DEXAMETHASONE SODIUM PHOSPHATE 4 MG/ML IJ SOLN
INTRAMUSCULAR | Status: DC | PRN
  Administered 2021-04-04: 4 mg via INTRAVENOUS

## 2021-04-04 MED ORDER — ACETAMINOPHEN 500 MG PO TABS
1000.0000 mg | ORAL_TABLET | ORAL | Status: AC
  Administered 2021-04-04: 1000 mg via ORAL
  Filled 2021-04-04: qty 2

## 2021-04-04 MED ORDER — ACETAMINOPHEN 160 MG/5ML PO SOLN: 1000.0000 mg | Freq: Once | ORAL | Status: DC | PRN

## 2021-04-04 MED ORDER — CHLORHEXIDINE GLUCONATE CLOTH 2 % EX PADS: 6.0000 | MEDICATED_PAD | Freq: Once | CUTANEOUS | Status: DC

## 2021-04-04 MED ORDER — LACTATED RINGERS IV SOLN: INTRAVENOUS | Status: DC

## 2021-04-04 MED ORDER — OXYCODONE HCL 5 MG PO TABS
5.0000 mg | ORAL_TABLET | Freq: Once | ORAL | Status: DC | PRN
Start: 2021-04-04 — End: 2021-04-04

## 2021-04-04 MED ORDER — ENSURE PRE-SURGERY PO LIQD: 296.0000 mL | Freq: Once | ORAL | Status: DC

## 2021-04-04 MED ORDER — BUPIVACAINE-EPINEPHRINE (PF) 0.25% -1:200000 IJ SOLN
INTRAMUSCULAR | Status: AC
  Filled 2021-04-04: qty 30

## 2021-04-04 MED ORDER — LIDOCAINE HCL 1 % IJ SOLN
INTRAMUSCULAR | Status: DC | PRN
  Administered 2021-04-04: 12 mL via INTRAMUSCULAR

## 2021-04-04 MED ORDER — EPHEDRINE SULFATE-NACL 50-0.9 MG/10ML-% IV SOSY
PREFILLED_SYRINGE | INTRAVENOUS | Status: DC | PRN
  Administered 2021-04-04: 5 mg via INTRAVENOUS

## 2021-04-04 MED ORDER — METHYLENE BLUE 1 % INJ SOLN
INTRAMUSCULAR | Status: DC | PRN
  Administered 2021-04-04: 1 mL via SUBMUCOSAL

## 2021-04-04 MED ORDER — FENTANYL CITRATE (PF) 250 MCG/5ML IJ SOLN
INTRAMUSCULAR | Status: AC
  Filled 2021-04-04: qty 5

## 2021-04-04 MED ORDER — MIDAZOLAM HCL 2 MG/2ML IJ SOLN
INTRAMUSCULAR | Status: AC
  Filled 2021-04-04: qty 2

## 2021-04-04 MED ORDER — FENTANYL CITRATE (PF) 100 MCG/2ML IJ SOLN: 50.0000 ug | Freq: Once | INTRAMUSCULAR | Status: AC

## 2021-04-04 MED ORDER — PROPOFOL 10 MG/ML IV BOLUS
INTRAVENOUS | Status: DC | PRN
  Administered 2021-04-04: 160 mg via INTRAVENOUS

## 2021-04-04 MED ORDER — CEFAZOLIN SODIUM-DEXTROSE 2-4 GM/100ML-% IV SOLN
2.0000 g | INTRAVENOUS | Status: AC
  Administered 2021-04-04: 2 g via INTRAVENOUS
  Filled 2021-04-04: qty 100

## 2021-04-04 MED ORDER — TECHNETIUM TC 99M TILMANOCEPT KIT
1.0000 | PACK | Freq: Once | INTRAVENOUS | Status: AC | PRN
  Administered 2021-04-04: 1 via INTRADERMAL

## 2021-04-04 MED ORDER — DEXAMETHASONE SODIUM PHOSPHATE 10 MG/ML IJ SOLN
INTRAMUSCULAR | Status: AC
  Filled 2021-04-04: qty 1

## 2021-04-04 MED ORDER — LIDOCAINE HCL (PF) 2 % IJ SOLN
INTRAMUSCULAR | Status: DC | PRN
  Administered 2021-04-04: 60 mg via INTRADERMAL

## 2021-04-04 MED ORDER — MIDAZOLAM HCL 2 MG/2ML IJ SOLN
INTRAMUSCULAR | Status: AC
  Administered 2021-04-04: 2 mg via INTRAVENOUS
  Filled 2021-04-04: qty 2

## 2021-04-04 MED ORDER — ORAL CARE MOUTH RINSE: 15.0000 mL | Freq: Once | OROMUCOSAL | Status: AC

## 2021-04-04 MED ORDER — ACETAMINOPHEN 10 MG/ML IV SOLN: 1000.0000 mg | Freq: Once | INTRAVENOUS | Status: DC | PRN

## 2021-04-04 MED ORDER — PROPOFOL 10 MG/ML IV BOLUS
INTRAVENOUS | Status: AC
  Filled 2021-04-04: qty 20

## 2021-04-04 MED ORDER — OXYCODONE HCL 5 MG PO TABS: 5.0000 mg | ORAL_TABLET | Freq: Four times a day (QID) | ORAL | 0 refills | Status: DC | PRN

## 2021-04-04 MED ORDER — OXYCODONE HCL 5 MG/5ML PO SOLN: 5.0000 mg | Freq: Once | ORAL | Status: DC | PRN

## 2021-04-04 MED ORDER — FENTANYL CITRATE (PF) 100 MCG/2ML IJ SOLN
50.0000 ug | Freq: Once | INTRAMUSCULAR | Status: AC
Start: 2021-04-04 — End: 2021-04-04
  Administered 2021-04-04: 50 ug via INTRAVENOUS

## 2021-04-04 SURGICAL SUPPLY — 65 items
ADH SKN CLS LQ APL DERMABOND (GAUZE/BANDAGES/DRESSINGS) ×2
APL PRP STRL LF DISP 70% ISPRP (MISCELLANEOUS) ×1
BLADE SURG 10 STRL SS (BLADE) ×2 IMPLANT
BNDG COHESIVE 3X5 TAN STRL LF (GAUZE/BANDAGES/DRESSINGS) ×1 IMPLANT
BNDG COHESIVE 4X5 TAN STRL (GAUZE/BANDAGES/DRESSINGS) IMPLANT
BNDG GAUZE ELAST 4 BULKY (GAUZE/BANDAGES/DRESSINGS) ×2 IMPLANT
CANISTER SUCT 3000ML PPV (MISCELLANEOUS) ×2 IMPLANT
CHLORAPREP W/TINT 26 (MISCELLANEOUS) ×2 IMPLANT
CLIP VESOCCLUDE MED 24/CT (CLIP) ×2 IMPLANT
CLIP VESOCCLUDE SM WIDE 24/CT (CLIP) ×2 IMPLANT
CLSR STERI-STRIP ANTIMIC 1/2X4 (GAUZE/BANDAGES/DRESSINGS) ×1 IMPLANT
CNTNR URN SCR LID CUP LEK RST (MISCELLANEOUS) ×3 IMPLANT
CONT SPEC 4OZ STRL OR WHT (MISCELLANEOUS) ×6
COVER MAYO STAND STRL (DRAPES) ×1 IMPLANT
COVER PROBE W GEL 5X96 (DRAPES) ×2 IMPLANT
COVER SURGICAL LIGHT HANDLE (MISCELLANEOUS) ×2 IMPLANT
COVER WAND RF STERILE (DRAPES) ×2 IMPLANT
DECANTER SPIKE VIAL GLASS SM (MISCELLANEOUS) ×2 IMPLANT
DERMABOND ADHESIVE PROPEN (GAUZE/BANDAGES/DRESSINGS) ×2
DERMABOND ADVANCED .7 DNX6 (GAUZE/BANDAGES/DRESSINGS) IMPLANT
DRAPE HALF SHEET 40X57 (DRAPES) ×2 IMPLANT
DRAPE LAPAROSCOPIC ABDOMINAL (DRAPES) ×2 IMPLANT
DRSG TEGADERM 4X4.75 (GAUZE/BANDAGES/DRESSINGS) ×4 IMPLANT
ELECT REM PT RETURN 9FT ADLT (ELECTROSURGICAL) ×2
ELECTRODE REM PT RTRN 9FT ADLT (ELECTROSURGICAL) ×1 IMPLANT
GAUZE SPONGE 2X2 8PLY STRL LF (GAUZE/BANDAGES/DRESSINGS) ×1 IMPLANT
GAUZE SPONGE 4X4 12PLY STRL (GAUZE/BANDAGES/DRESSINGS) ×1 IMPLANT
GAUZE XEROFORM 5X9 LF (GAUZE/BANDAGES/DRESSINGS) ×1 IMPLANT
GLOVE BIO SURGEON STRL SZ 6 (GLOVE) ×2 IMPLANT
GLOVE SURG UNDER LTX SZ6.5 (GLOVE) ×2 IMPLANT
GOWN STRL REUS W/ TWL LRG LVL3 (GOWN DISPOSABLE) ×2 IMPLANT
GOWN STRL REUS W/TWL 2XL LVL3 (GOWN DISPOSABLE) ×4 IMPLANT
GOWN STRL REUS W/TWL LRG LVL3 (GOWN DISPOSABLE) ×4
KIT BASIN OR (CUSTOM PROCEDURE TRAY) ×2 IMPLANT
KIT TURNOVER KIT B (KITS) ×2 IMPLANT
MARKER SKIN DUAL TIP RULER LAB (MISCELLANEOUS) ×3 IMPLANT
NDL 18GX1X1/2 (RX/OR ONLY) (NEEDLE) ×1 IMPLANT
NDL FILTER BLUNT 18X1 1/2 (NEEDLE) IMPLANT
NDL HYPO 25GX1X1/2 BEV (NEEDLE) ×1 IMPLANT
NEEDLE 18GX1X1/2 (RX/OR ONLY) (NEEDLE) ×2 IMPLANT
NEEDLE 22X1 1/2 (OR ONLY) (NEEDLE) ×2 IMPLANT
NEEDLE FILTER BLUNT 18X 1/2SAF (NEEDLE)
NEEDLE FILTER BLUNT 18X1 1/2 (NEEDLE) IMPLANT
NEEDLE HYPO 25GX1X1/2 BEV (NEEDLE) ×2 IMPLANT
NS IRRIG 1000ML POUR BTL (IV SOLUTION) ×2 IMPLANT
PACK GENERAL/GYN (CUSTOM PROCEDURE TRAY) ×2 IMPLANT
PACK UNIVERSAL I (CUSTOM PROCEDURE TRAY) IMPLANT
PAD ARMBOARD 7.5X6 YLW CONV (MISCELLANEOUS) ×4 IMPLANT
PENCIL SMOKE EVACUATOR (MISCELLANEOUS) ×2 IMPLANT
SPECIMEN JAR MEDIUM (MISCELLANEOUS) ×2 IMPLANT
SPONGE GAUZE 2X2 STER 10/PKG (GAUZE/BANDAGES/DRESSINGS) ×1
STOCKINETTE IMPERVIOUS 9X36 MD (GAUZE/BANDAGES/DRESSINGS) ×2 IMPLANT
STRIP CLOSURE SKIN 1/2X4 (GAUZE/BANDAGES/DRESSINGS) ×2 IMPLANT
SUT ETHILON 2 0 FS 18 (SUTURE) ×3 IMPLANT
SUT ETHILON 2 0 PSLX (SUTURE) ×2 IMPLANT
SUT MNCRL AB 4-0 PS2 18 (SUTURE) ×3 IMPLANT
SUT SILK 2 0 PERMA HAND 18 BK (SUTURE) ×2 IMPLANT
SUT VIC AB 2-0 SH 27 (SUTURE) ×8
SUT VIC AB 2-0 SH 27XBRD (SUTURE) ×2 IMPLANT
SUT VIC AB 3-0 SH 27 (SUTURE) ×4
SUT VIC AB 3-0 SH 27X BRD (SUTURE) ×2 IMPLANT
SUT VIC AB 3-0 SH 8-18 (SUTURE) ×1 IMPLANT
SYR CONTROL 10ML LL (SYRINGE) ×4 IMPLANT
TOWEL GREEN STERILE (TOWEL DISPOSABLE) ×2 IMPLANT
TOWEL GREEN STERILE FF (TOWEL DISPOSABLE) ×2 IMPLANT

## 2021-04-04 NOTE — Op Note (Signed)
PRE-OPERATIVE DIAGNOSIS: [id number] 0 left forearm melanoma  POST-OPERATIVE DIAGNOSIS:  Same  PROCEDURE:  Procedure(s): Wide local excision 2 cm margins, advancement flap closure for defect 7 cm x 5 cm, left sentinel lymph node mapping and biopsy  SURGEON:  Surgeon(s): Stark Klein, MD  ASSIST:  Ewell Poe, RNFA  ANESTHESIA:   local and general  DRAINS: none   LOCAL MEDICATIONS USED:  MARCAINE    and XYLOCAINE   SPECIMEN:  Source of Specimen:  two deep left axillary sentinel lymph nodes, wide local excision left forearm melanoma   FINDINGS:  no gross residual disease.  Soft nodes.  SLN #1 hot and blue.  Adjacent node enlarged.   DISPOSITION OF SPECIMEN:  PATHOLOGY  COUNTS:  YES  PLAN OF CARE: Discharge to home after PACU  PATIENT DISPOSITION:  PACU - hemodynamically stable.    PROCEDURE:   Pt was identified in the holding area, taken to the OR, and placed supine on the OR table.  General anesthesia was induced.  Time out was performed according to the surgical safety checklist.  When all was correct, we continued.  One mL methylene blue was injected intradermally around the melanoma biopsy site.    The patient was placed into the supine position.  The left arm and left upper chest were prepped and draped in sterile fashion.  The melanoma was identified and 2 cm margins were marked out.  This was turned into an ellipse.  Local was administered under the melanoma and the adjacent tissue.  A #10 blade was used to incise the skin around the melanoma.  The cautery was used to take the dissection down to the fascia.  The skin was marked in situ with orientation sutures.  The cautery was used to take the specimen off the fascia, and it was passed off the table.    Skin hooks were used to elevate the edges of the incision and the skin was freed up in all directions.  This was pulled together in a longitudinal orientation. The skin was pulled together to check the tension. . Deep  interrupted 2-0 vicryl sutures were placed to relieve tension.  The skin in the center was very tight.  A relaxing incision was placed on the more dorsal aspect of the skin.  Part of this was closed transversely, but the smaller relaxing incision was run with locking 2-0 nylon.  The skin was then reapproximated with 3-0 interrupted vicryl deep dermal sutures and 4-0 monocryl running subcuticular sutures.  Two 2-0 nylon horizontal mattress sutures were placed as well.    The axilla was then addressed.  The point of maximum signal intensity was identified with the neoprobe.  A 4 cm incision was made with a #15 blade.  The subcutaneous tissues were divided with the cautery.  A Weitlaner retractor was used to assist with visualization.  The tonsil clamp was used to bluntly dissect the axillary fat pad.  Two deep left axillary sentinel lymph nodes were identified as described above.  The lymphovascular channels were clipped with hemoclips.  The nodes were passed off as specimens.  Hemostasis was achieved with the cautery.  The axilla was irrigated and closed with 3-0 Vicryl deep dermal interrupted sutures and 4-0 Monocryl running subcuticular suture.  The axilla was dressed with dermabond.    The melanoma site was cleaned, dried, and dressed with Benzoin, steristrips, gauze, coban.  The relaxing incision was dressed with xeroform. The axillary incision was closed with dermabond.   Needle, sponge,  and instrument counts were correct.  The patient was awakened from anesthesia and taken to the PACU in stable condition.

## 2021-04-04 NOTE — Anesthesia Procedure Notes (Signed)
Procedure Name: LMA Insertion Date/Time: 04/04/2021 1:17 PM Performed by: Trinna Post., CRNA Pre-anesthesia Checklist: Patient identified, Emergency Drugs available, Suction available, Patient being monitored and Timeout performed Patient Re-evaluated:Patient Re-evaluated prior to induction Oxygen Delivery Method: Circle system utilized Preoxygenation: Pre-oxygenation with 100% oxygen Induction Type: IV induction LMA: LMA inserted LMA Size: 4.0 Number of attempts: 1 Placement Confirmation: positive ETCO2 and breath sounds checked- equal and bilateral Tube secured with: Tape Dental Injury: Teeth and Oropharynx as per pre-operative assessment

## 2021-04-04 NOTE — Discharge Instructions (Addendum)
[place] Office Phone Number [phone]   POST OP INSTRUCTIONS  Always review your discharge instruction sheet given to you by the facility where your surgery was performed.  IF YOU HAVE DISABILITY OR FAMILY LEAVE FORMS, YOU MUST BRING THEM TO THE OFFICE FOR PROCESSING.  DO NOT GIVE THEM TO YOUR DOCTOR.  A prescription for pain medication may be given to you upon discharge.  Take your pain medication as prescribed, if needed.  If narcotic pain medicine is not needed, then you may take acetaminophen (Tylenol) or ibuprofen (Advil) as needed. Take your usually prescribed medications unless otherwise directed If you need a refill on your pain medication, please contact your pharmacy.  They will contact our office to request authorization.  Prescriptions will not be filled after 5pm or on week-ends. You should eat very light the first 24 hours after surgery, such as soup, crackers, pudding, etc.  Resume your normal diet the day after surgery It is common to experience some constipation if taking pain medication after surgery.  Increasing fluid intake and taking a stool softener will usually help or prevent this problem from occurring.  A mild laxative (Milk of Magnesia or Miralax) should be taken according to package directions if there are no bowel movements after 48 hours. You may shower in 48 hours.  Redress the incisions on the arm with dry gauze.  The surgical glue will flake off from the armpit in 2-3 weeks.   ACTIVITIES:  No strenuous activity or heavy lifting for 2 weeks.   You may drive when you no longer are taking prescription pain medication, you can comfortably wear a seatbelt, and you can safely maneuver your car and apply brakes. RETURN TO WORK:  __________1-2 weeks _______________ Dennis Bast should see your doctor in the office for a follow-up appointment approximately 2 weeks after your surgery.    WHEN TO CALL YOUR DOCTOR: Fever over 101.0 Nausea and/or  vomiting. Extreme swelling or bruising. Continued bleeding from incision. Increased pain, redness, or drainage from the incision.  The clinic staff is available to answer your questions during regular business hours.  Please don't hesitate to call and ask to speak to one of the nurses for clinical concerns.  If you have a medical emergency, go to the nearest emergency room or call 911.  A surgeon from [facility] is always on call at the hospital.  For further questions, please visit [email or web address]

## 2021-04-04 NOTE — Anesthesia Procedure Notes (Signed)
Anesthesia Regional Block: Pectoralis block   Pre-Anesthetic Checklist: , timeout performed,  Correct Patient, Correct Site, Correct Laterality,  Correct Procedure, Correct Position, site marked,  Risks and benefits discussed,  Surgical consent,  Pre-op evaluation,  At surgeon's request and post-op pain management  Laterality: Left  Prep: chloraprep       Needles:  Injection technique: Single-shot      Needle Length: 9cm  Needle Gauge: 22     Additional Needles: Arrow StimuQuik ECHO Echogenic Stimulating PNB Needle  Procedures:,,,, ultrasound used (permanent image in chart),,    Narrative:  Start time: 04/04/2021 12:49 PM End time: 04/04/2021 12:52 PM Injection made incrementally with aspirations every 5 mL.  Performed by: Personally  Anesthesiologist: Oleta Mouse, MD

## 2021-04-04 NOTE — Transfer of Care (Signed)
Immediate Anesthesia Transfer of Care Note  Patient: Manuel Romero  Procedure(s) Performed: WIDE LOCAL EXCISION, ADVANCED FLAP CLOSURE LEFT FOREARM MELANOMA WITH SENTINEL LYMPH NODE BIOPSY AND MAPPING  Patient Location: PACU  Anesthesia Type:General  Level of Consciousness: awake  Airway & Oxygen Therapy: Patient Spontanous Breathing  Post-op Assessment: Report given to RN  Post vital signs: Reviewed and stable  Last Vitals:  Vitals Value Taken Time  BP 128/69 04/04/21 1535  Temp    Pulse 61 04/04/21 1535  Resp 12 04/04/21 1535  SpO2 97 % 04/04/21 1535    Last Pain:  Vitals:   04/04/21 1135  TempSrc:   PainSc: 0-No pain         Complications: No notable events documented.

## 2021-04-04 NOTE — Anesthesia Preprocedure Evaluation (Addendum)
Anesthesia Evaluation  Patient identified by MRN, date of birth, ID band Patient awake    Reviewed: Allergy & Precautions, NPO status , Patient's Chart, lab work & pertinent test results  History of Anesthesia Complications Negative for: history of anesthetic complications  Airway Mallampati: I  TM Distance: >3 FB Neck ROM: Full    Dental  (+) Dental Advisory Given, Teeth Intact   Pulmonary sleep apnea ,  Covid-19 Nucleic Acid Test Results No results found for: SARSCOV2NAA, SARSCOV2    breath sounds clear to auscultation       Cardiovascular hypertension, Pt. on medications  Rhythm:Regular     Neuro/Psych negative neurological ROS  negative psych ROS   GI/Hepatic negative GI ROS, Neg liver ROS,   Endo/Other  diabetes, Type 2  Renal/GU negative Renal ROS     Musculoskeletal negative musculoskeletal ROS (+)   Abdominal   Peds  Hematology negative hematology ROS (+)   Anesthesia Other Findings   Reproductive/Obstetrics                            Anesthesia Physical Anesthesia Plan  ASA: 2  Anesthesia Plan: General and Regional   Post-op Pain Management:    Induction: Intravenous  PONV Risk Score and Plan: 2 and Ondansetron and Dexamethasone  Airway Management Planned: LMA  Additional Equipment: None  Intra-op Plan:   Post-operative Plan: Extubation in OR  Informed Consent: I have reviewed the patients History and Physical, chart, labs and discussed the procedure including the risks, benefits and alternatives for the proposed anesthesia with the patient or authorized representative who has indicated his/her understanding and acceptance.     Dental advisory given  Plan Discussed with: CRNA and Surgeon  Anesthesia Plan Comments:         Anesthesia Quick Evaluation

## 2021-04-05 ENCOUNTER — Encounter (HOSPITAL_COMMUNITY): Payer: Self-pay | Admitting: General Surgery

## 2021-04-07 NOTE — Anesthesia Postprocedure Evaluation (Signed)
Anesthesia Post Note  Patient: Manuel Romero  Procedure(s) Performed: WIDE LOCAL EXCISION, ADVANCED FLAP CLOSURE LEFT FOREARM MELANOMA WITH SENTINEL LYMPH NODE BIOPSY AND [place]     Patient location during evaluation: PACU Anesthesia Type: Regional and General Level of consciousness: awake and alert Pain management: pain level controlled Vital Signs Assessment: post-procedure vital signs reviewed and stable Respiratory status: spontaneous breathing, nonlabored ventilation, respiratory function stable and patient connected to nasal cannula oxygen Cardiovascular status: blood pressure returned to baseline and stable Postop Assessment: no apparent nausea or vomiting Anesthetic complications: no   No notable events documented.  Last Vitals:  Vitals:   04/04/21 1549 04/04/21 1604  BP: 128/67 (!) 150/73  Pulse: (!) 57 (!) 59  Resp: 15 16  Temp:  36.8 C  SpO2: 95% 95%    Last Pain:  Vitals:   04/04/21 1604  TempSrc:   PainSc: 0-No pain                 Loralei Radcliffe

## 2021-04-12 LAB — SURGICAL PATHOLOGY

## 2021-05-03 DIAGNOSIS — C4362 Malignant melanoma of left upper limb, including shoulder: Secondary | ICD-10-CM | POA: Insufficient documentation

## 2021-06-10 ENCOUNTER — Ambulatory Visit: Payer: BC Managed Care – PPO | Admitting: Podiatry

## 2021-06-11 ENCOUNTER — Other Ambulatory Visit: Payer: Self-pay

## 2021-06-11 ENCOUNTER — Ambulatory Visit (INDEPENDENT_AMBULATORY_CARE_PROVIDER_SITE_OTHER): Payer: BC Managed Care – PPO | Admitting: Podiatry

## 2021-06-11 DIAGNOSIS — L84 Corns and callosities: Secondary | ICD-10-CM

## 2021-06-11 DIAGNOSIS — E1151 Type 2 diabetes mellitus with diabetic peripheral angiopathy without gangrene: Secondary | ICD-10-CM

## 2021-06-11 DIAGNOSIS — B353 Tinea pedis: Secondary | ICD-10-CM

## 2021-06-11 DIAGNOSIS — M79674 Pain in right toe(s): Secondary | ICD-10-CM | POA: Diagnosis not present

## 2021-06-11 DIAGNOSIS — M79675 Pain in left toe(s): Secondary | ICD-10-CM | POA: Diagnosis not present

## 2021-06-11 DIAGNOSIS — B351 Tinea unguium: Secondary | ICD-10-CM

## 2021-06-11 NOTE — Patient Instructions (Signed)
Lotrimin Spray Powder:  Spray between toes once daily.  Athlete's Foot Athlete's foot (tinea pedis) is a fungal infection of the skin on your feet. It often occurs on the skin that is between or underneath the toes. It can also occur on the soles of your feet. The infection can spread from person to person (is contagious). It can also spread when a person's bare feet come in contact with the funguson shower floors or on items such as shoes. What are the causes? This condition is caused by a fungus that grows in warm, moist places. You can get athlete's foot by sharing shoes, shower stalls, towels, and wet floors with someone who is infected. Not washing your feet or changing your socks oftenenough can also lead to athlete's foot. What increases the risk? This condition is more likely to develop in: Men. People who have a weak body defense system (immune system). People who have diabetes. People who use public showers, such as at a gym. People who wear heavy-duty shoes, such as Environmental manager. Seasons with warm, humid weather. What are the signs or symptoms? Symptoms of this condition include: Itchy areas between your toes or on the soles of your feet. White, flaky, or scaly areas between your toes or on the soles of your feet. Very itchy small blisters between your toes or on the soles of your feet. Small cuts in your skin. These cuts can become infected. Thick or discolored toenails. How is this diagnosed? This condition may be diagnosed with a physical exam and a review of your medical history. Your health care provider may also take a skin or toenailsample to examine under a microscope. How is this treated? This condition is treated with antifungal medicines. These may be applied as powders, ointments, or creams. In severe cases, an oral antifungal medicine maybe given. Follow these instructions at home: Medicines Apply or take over-the-counter and prescription medicines  only as told by your health care provider. Apply your antifungal medicine as told by your health care provider. Do not stop using the antifungal even if your condition improves. Foot care Do not scratch your feet. Keep your feet dry: Wear cotton or wool socks. Change your socks every day or if they become wet. Wear shoes that allow air to flow, such as sandals or canvas tennis shoes. Wash and dry your feet, including the area between your toes. Also, wash and dry your feet: Every day or as told by your health care provider. After exercising. General instructions Do not let others use towels, shoes, nail clippers, or other personal items that touch your feet. Protect your feet by wearing sandals in wet areas, such as locker rooms and shared showers. Keep all follow-up visits as told by your health care provider. This is important. If you have diabetes, keep your blood sugar under control. Contact a health care provider if: You have a fever. You have swelling, soreness, warmth, or redness in your foot. Your feet are not getting better with treatment. Your symptoms get worse. You have new symptoms. Summary Athlete's foot (tinea pedis) is a fungal infection of the skin on your feet. It often occurs on skin that is between or underneath the toes. This condition is caused by a fungus that grows in warm, moist places. Symptoms include white, flaky, or scaly areas between your toes or on the soles of your feet. This condition is treated with antifungal medicines. Keep your feet clean. Always dry them thoroughly. This information is  not intended to replace advice given to you by your health care provider. Make sure you discuss any questions you have with your healthcare provider. Document Revised: 05/31/2020 Document Reviewed: 05/31/2020 Elsevier Patient Education  2022 [employer].

## 2021-06-15 ENCOUNTER — Encounter: Payer: Self-pay | Admitting: Podiatry

## 2021-06-15 NOTE — Progress Notes (Signed)
Subjective: Manuel Romero is a pleasant 65 y.o. male patient seen today for preulcerative calluses b/l and  painful thick toenails that are difficult to trim. Pain interferes with ambulation. Aggravating factors include wearing enclosed shoe gear. Pain is relieved with periodic professional debridement.  He has h/o diabetes and peripheral vascular disease. He states his blood glucose reading was 123 mg/dl today.  Patient also states he had excision of a melanoma of his left forearm.  PCP is [name], [place]. Last visit was: February, 2022.  Allergies  Allergen Reactions   Chromium     Other reaction(s): dizziness    Objective: Physical Exam  General: Manuel Romero is a pleasant 65 y.o. Caucasian male, WD, WN in NAD. AAO x 3.   Vascular:  Capillary fill time to digits <3 seconds b/l lower extremities. Faintly palpable DP pulse(s) b/l lower extremities. Faintly palpable PT pulse(s) b/l lower extremities. Pedal hair absent. Lower extremity skin temperature gradient within normal limits. No pain with calf compression b/l. Nonpitting edema noted right lower extremity. Evidence of chronic venous insufficiency right lower extremity.  Dermatological:  Pedal skin with normal turgor, texture and tone b/l lower extremities. No open wounds b/l lower extremities. Interdigital maceration noted webspace(s) 1-4 b/l. No blistering, no weeping, no open wounds. Toenails 1-5 b/l elongated, discolored, dystrophic, thickened, crumbly with subungual debris and tenderness to dorsal palpation. Hyperkeratotic lesion(s) submet head 2 left foot.  No erythema, no edema, no drainage, no fluctuance. Preulcerative lesion noted L hallux. There is visible subdermal hemorrhage. There is no surrounding erythema, no edema, no drainage, no odor, no fluctuance.  Musculoskeletal:  Normal muscle strength 5/5 to all lower extremity muscle groups bilaterally. Hallux valgus with bunion deformity noted b/l lower  extremities.  Neurological:  Protective sensation intact 5/5 intact bilaterally with 10g monofilament b/l. Vibratory sensation intact b/l.  Assessment and Plan:  1. Pain due to onychomycosis of toenails of both feet   2. Callus   3. Tinea pedis of both feet   4. Type II diabetes mellitus with peripheral circulatory disorder (HCC)      -Examined patient. -For tinea pedis, he was instructed to apply Lotrimin Spary Powder between toes once daily. -Continue diabetic foot care principles: inspect feet daily, monitor glucose as recommended by PCP and/or Endocrinologist, and follow prescribed diet per PCP, Endocrinologist and/or dietician. -Patient to continue soft, supportive shoe gear daily. -Toenails 1-5 b/l were debrided in length and girth with sterile nail nippers and dremel without iatrogenic bleeding.  -Callus(es) submet head 2 left foot pared utilizing sterile scalpel blade without complication or incident. Total number debrided =1. -Preulcerative lesion pared L hallux.  -Patient to report any pedal injuries to medical professional immediately. -Patient/POA to call should there be question/concern in the interim.  Return in about 3 months (around 09/11/2021).  Marzetta Board, DPM

## 2021-07-03 ENCOUNTER — Ambulatory Visit: Payer: BC Managed Care – PPO | Admitting: Vascular Surgery

## 2021-07-04 ENCOUNTER — Ambulatory Visit: Payer: BC Managed Care – PPO | Admitting: Vascular Surgery

## 2021-07-10 DIAGNOSIS — E1169 Type 2 diabetes mellitus with other specified complication: Secondary | ICD-10-CM | POA: Diagnosis not present

## 2021-07-10 DIAGNOSIS — E7800 Pure hypercholesterolemia, unspecified: Secondary | ICD-10-CM | POA: Diagnosis not present

## 2021-07-10 DIAGNOSIS — I872 Venous insufficiency (chronic) (peripheral): Secondary | ICD-10-CM | POA: Diagnosis not present

## 2021-07-10 DIAGNOSIS — I10 Essential (primary) hypertension: Secondary | ICD-10-CM | POA: Diagnosis not present

## 2021-07-18 ENCOUNTER — Other Ambulatory Visit: Payer: Self-pay

## 2021-07-18 ENCOUNTER — Ambulatory Visit (INDEPENDENT_AMBULATORY_CARE_PROVIDER_SITE_OTHER): Payer: BC Managed Care – PPO | Admitting: Vascular Surgery

## 2021-07-18 ENCOUNTER — Encounter: Payer: Self-pay | Admitting: Vascular Surgery

## 2021-07-18 VITALS — BP 123/71 | HR 52 | Temp 98.6°F | Resp 20 | Ht 76.0 in | Wt 231.0 lb

## 2021-07-18 DIAGNOSIS — I83811 Varicose veins of right lower extremities with pain: Secondary | ICD-10-CM

## 2021-07-18 DIAGNOSIS — I872 Venous insufficiency (chronic) (peripheral): Secondary | ICD-10-CM

## 2021-07-18 NOTE — Progress Notes (Signed)
REASON FOR VISIT:   Follow-up of venous disease  MEDICAL ISSUES:   CHRONIC VENOUS INSUFFICIENCY: This patient has CEAP C4a venous disease.  This patient has fairly advanced venous disease with hyperpigmentation and markedly dilated varicose veins.  However his symptoms are tolerable at this point His compression stockings have been helping.  We have encouraged him to continue leg elevation and I have discussed the proper positioning for this with him.  I have encouraged him to continue to wear his thigh-high stockings.  He knows to avoid prolonged sitting and standing.  He will continue to exercise.  If his symptoms progress I think he would be a good candidate for laser ablation of the right great saphenous vein with 10-20 stab phlebectomies.  He will call if his symptoms progress.  HPI:   Manuel Romero is a pleasant 65 y.o. male who was seen by Karoline Caldwell, PA, on 03/20/2021 with painful varicose veins of the right leg.  He was complaining of fatigue in the right leg which was aggravated by standing and relieved with elevation.  Symptoms are worse at the end of the day.  He had recently been given some knee-high compression stockings with a gradient of 20 to 30 mmHg.  Since he was seen last he has been wearing his compression stockings.  He does think that these helped his symptoms significantly.  He is also been elevating his legs.  He is very active.  Has had no previous history of DVT and no previous venous procedures.   Past Medical History:  Diagnosis Date   Basal cell carcinoma    Back, shoulder, chest, bil arm, and nose   HTN (hypertension)    Type 2 diabetes mellitus (HCC)     Family History  Problem Relation Age of Onset   Diabetes Brother    Neuropathy Brother     SOCIAL HISTORY: Social History   Tobacco Use   Smoking status: Never   Smokeless tobacco: Never  Substance Use Topics   Alcohol use: Not Currently    Allergies  Allergen Reactions   Chromium      Other reaction(s): dizziness    Current Outpatient Medications  Medication Sig Dispense Refill   APPLE CIDER VINEGAR PO Take 1 tablet by mouth daily.     atorvastatin (LIPITOR) 20 MG tablet Take 20 mg by mouth daily.     metFORMIN (GLUCOPHAGE) 1000 MG tablet Take 1,000 mg by mouth 2 (two) times daily with a meal.     Multiple Vitamin (MULTIVITAMINS PO) Take 1 tablet by mouth daily.     Multiple Vitamins-Minerals (AIRBORNE) TBEF Take 1 tablet by mouth daily.     valsartan-hydrochlorothiazide (DIOVAN-HCT) 80-12.5 MG tablet Take 1 tablet by mouth daily.     NONFORMULARY OR COMPOUNDED ITEM [facility]:  Onychomycosis Nail Lacquer - Fluconazole 2%, Terbinafine 1%, DMSO, apply to affected area daily. (Patient not taking: No sig reported) 120 each 11   No current facility-administered medications for this visit.    REVIEW OF SYSTEMS:  [X]  denotes positive finding, [ ]  denotes negative finding Cardiac  Comments:  Chest pain or chest pressure:    Shortness of breath upon exertion:    Short of breath when lying flat:    Irregular heart rhythm:        Vascular    Pain in calf, thigh, or hip brought on by ambulation:    Pain in feet at night that wakes you up from your sleep:  Blood clot in your veins:    Leg swelling:         Pulmonary    Oxygen at home:    Productive cough:     Wheezing:         Neurologic    Sudden weakness in arms or legs:     Sudden numbness in arms or legs:     Sudden onset of difficulty speaking or slurred speech:    Temporary loss of vision in one eye:     Problems with dizziness:         Gastrointestinal    Blood in stool:     Vomited blood:         Genitourinary    Burning when urinating:     Blood in urine:        Psychiatric    Major depression:         Hematologic    Bleeding problems:    Problems with blood clotting too easily:        Skin    Rashes or ulcers:        Constitutional    Fever or chills:     PHYSICAL EXAM:    Vitals:   07/18/21 0824  BP: 123/71  Pulse: (!) 52  Resp: 20  Temp: 98.6 F (37 C)  SpO2: 98%  Weight: 231 lb (104.8 kg)  Height: 6\' 4"  (1.93 m)    GENERAL: The patient is a well-nourished male, in no acute distress. The vital signs are documented above. CARDIAC: There is a regular rate and rhythm.  VASCULAR: I do not detect carotid bruits. He has palpable pedal pulses. He has some markedly enlarged varicose veins along the medial aspect of his right thigh and calf as documented in the photograph below.    He has hyperpigmentation bilaterally but especially on the right.    I did look at his right great saphenous vein myself with the SonoSite.  It is markedly dilated throughout with reflux down to the knee.  I would likely cannulate the vein in the distal to mid thigh above where these large tributaries come off.  PULMONARY: There is good air exchange bilaterally without wheezing or rales. ABDOMEN: Soft and non-tender with normal pitched bowel sounds.  MUSCULOSKELETAL: There are no major deformities or cyanosis. NEUROLOGIC: No focal weakness or paresthesias are detected. SKIN: There are no ulcers or rashes noted. PSYCHIATRIC: The patient has a normal affect.  DATA:    VENOUS DUPLEX: I have reviewed the venous duplex scan that was done on 03/20/2021.  This was of the right lower extremity only.  There was no evidence of DVT.  There was deep venous reflux involving the common femoral vein and femoral vein.  There was significant superficial venous reflux in the right great saphenous vein from the saphenofemoral junction to the proximal calf.  Diameters of the vein in the thigh ranged from 7 to 8 mm.      Deitra Mayo [facility] [phone]

## 2021-09-17 ENCOUNTER — Other Ambulatory Visit: Payer: Self-pay

## 2021-09-17 ENCOUNTER — Ambulatory Visit (INDEPENDENT_AMBULATORY_CARE_PROVIDER_SITE_OTHER): Payer: BC Managed Care – PPO | Admitting: Podiatry

## 2021-09-17 ENCOUNTER — Encounter: Payer: Self-pay | Admitting: Podiatry

## 2021-09-17 DIAGNOSIS — E119 Type 2 diabetes mellitus without complications: Secondary | ICD-10-CM

## 2021-09-17 DIAGNOSIS — B351 Tinea unguium: Secondary | ICD-10-CM

## 2021-09-17 DIAGNOSIS — M2012 Hallux valgus (acquired), left foot: Secondary | ICD-10-CM

## 2021-09-17 DIAGNOSIS — M2011 Hallux valgus (acquired), right foot: Secondary | ICD-10-CM | POA: Diagnosis not present

## 2021-09-17 DIAGNOSIS — M79675 Pain in left toe(s): Secondary | ICD-10-CM

## 2021-09-17 DIAGNOSIS — M79674 Pain in right toe(s): Secondary | ICD-10-CM | POA: Diagnosis not present

## 2021-09-17 DIAGNOSIS — B353 Tinea pedis: Secondary | ICD-10-CM

## 2021-09-17 DIAGNOSIS — E1151 Type 2 diabetes mellitus with diabetic peripheral angiopathy without gangrene: Secondary | ICD-10-CM | POA: Diagnosis not present

## 2021-09-17 DIAGNOSIS — L84 Corns and callosities: Secondary | ICD-10-CM | POA: Diagnosis not present

## 2021-09-17 NOTE — Progress Notes (Signed)
ANNUAL DIABETIC FOOT EXAM  Subjective: Manuel Romero presents today for for annual diabetic foot examination and at risk foot care. Patient has history of venous insufficiency. He also has h/o right foot wound  4th webspace which healed uneventfully. He voices no new pedal concerns on today's visit .  Patient relates 4 year h/o diabetes.  Patient denies any numbness, tingling, burning, or pins/needle sensation in feet.  Patient's blood sugar was 130 mg/dl today.   [name], PA is patient's PCP. Last visit was 07/10/2021.  Past Medical History:  Diagnosis Date   Basal cell carcinoma    Back, shoulder, chest, bil arm, and nose   HTN (hypertension)    Type 2 diabetes mellitus [facility])    Patient Active Problem List   Diagnosis Date Noted   Malignant melanoma of left forearm ([place]) 05/03/2021   Allergic rhinitis 03/20/2021   Hyperlipidemia 03/20/2021   Obesity 03/20/2021   Peripheral venous insufficiency 03/20/2021   Pure hypercholesterolemia 03/20/2021   Type 2 diabetes mellitus ([place]) 05/16/2018   HTN (hypertension) 05/16/2018   Past Surgical History:  Procedure Laterality Date   APPENDECTOMY  1964   BASAL CELL CARCINOMA EXCISION     Shoulder, Chest, Back, Bil Arms, R ear, and nose   EXCISION MELANOMA WITH SENTINEL LYMPH NODE BIOPSY N/A 04/04/2021   Procedure: WIDE LOCAL EXCISION, ADVANCED FLAP CLOSURE LEFT FOREARM MELANOMA WITH SENTINEL LYMPH NODE BIOPSY AND MAPPING;  Surgeon: Stark Klein, MD;  Location: [place];  Service: General;  Laterality: N/A;  GEN AND PEC BLOCK   EYE SURGERY Bilateral    cataracts   FINGER FRACTURE SURGERY Right    Right middle finger   WISDOM TOOTH EXTRACTION Bilateral    Current Outpatient Medications on File Prior to Visit  Medication Sig Dispense Refill   APPLE CIDER VINEGAR PO Take 1 tablet by mouth daily.     atorvastatin (LIPITOR) 20 MG tablet Take 20 mg by mouth daily.     metFORMIN (GLUCOPHAGE) 1000 MG tablet Take 1,000 mg by mouth 2  (two) times daily with a meal.     Multiple Vitamin (MULTIVITAMINS PO) Take 1 tablet by mouth daily.     Multiple Vitamins-Minerals (AIRBORNE) TBEF Take 1 tablet by mouth daily.     NONFORMULARY OR COMPOUNDED ITEM [facility]:  Onychomycosis Nail Lacquer - Fluconazole 2%, Terbinafine 1%, DMSO, apply to affected area daily. (Patient not taking: No sig reported) 120 each 11   valsartan-hydrochlorothiazide (DIOVAN-HCT) 80-12.5 MG tablet Take 1 tablet by mouth daily.     No current facility-administered medications on file prior to visit.    Allergies  Allergen Reactions   Chromium     Other reaction(s): dizziness   Wound Dressing Adhesive     Other reaction(s): rash   Social History   Occupational History   Not on file  Tobacco Use   Smoking status: Never   Smokeless tobacco: Never  Vaping Use   Vaping Use: Never used  Substance and Sexual Activity   Alcohol use: Not Currently   Drug use: Never   Sexual activity: Yes   Family History  Problem Relation Age of Onset   Diabetes Brother    Neuropathy Brother    Immunization History  Administered Date(s) Administered   Influenza, Quadrivalent, Recombinant, Inj, Pf 09/01/2019, 08/03/2020   Influenza,inj,Quad PF,6+ Mos 08/25/2017, 10/04/2018   Td 06/15/2018   Tdap 02/07/2009     Review of Systems: Negative except as noted in the HPI.   Objective:  There were no vitals filed for this visit.  Manuel Romero is a pleasant 65 y.o. male in NAD. AAO X 3.  Vascular Examination: CFT <3 seconds b/l LE. Faintly palpable DP pulses b/l LE. Faintly palpable PT pulse(s) b/l LE. Pedal hair absent. No pain with calf compression RLE. Evidence of chronic venous insufficiency b/l LE. No ischemia or gangrene noted b/l LE. No cyanosis or clubbing noted b/l LE.  Dermatological Examination: Pedal skin is warm and supple b/l LE. No open wounds b/l LE. Interdigital maceration noted mild in nature 1-4 b/l webspace(s). No blistering, no weeping,  no open wounds. Toenails 1-5 b/l elongated, discolored, dystrophic, thickened, crumbly with subungual debris and tenderness to dorsal palpation. Hyperkeratotic lesion(s) bilateral great toes and submet head 1 right foot.  No erythema, no edema, no drainage, no fluctuance. Skin changes consistent with venous stasis noted b/l lower extremities.  Musculoskeletal Examination: Normal muscle strength 5/5 to all lower extremity muscle groups bilaterally. HAV with bunion deformity noted b/l LE.[name] No pain, crepitus or joint limitation noted with ROM b/l LE.  Patient ambulates independently without assistive aids.  Footwear Assessment: Does the patient wear appropriate shoes? Yes. Does the patient need inserts/orthotics? Yes..  Neurological Examination: Protective sensation intact 5/5 intact bilaterally with 10g monofilament b/l. Vibratory sensation intact b/l.  Hemoglobin A1C Latest Ref Rng & Units 03/27/2021  HGBA1C 4.8 - 5.6 % 6.2(H)  Some recent data might be hidden     Assessment: 1. Pain due to onychomycosis of toenails of both feet   2. Callus   3. Tinea pedis of both feet   4. Hallux valgus, acquired, bilateral   5. Type II diabetes mellitus with peripheral circulatory disorder (HCC)   6. Encounter for diabetic foot exam ([place])    ADA Risk Categorization:  High Risk  Patient has one or more of the following: Loss of protective sensation Absent pedal pulses Severe Foot deformity History of foot ulcer  Plan: -For interdigital tinea pedis, patient instructed to use Lotrimin Antifungal Spray Powder between toes once daily. -Diabetic foot examination performed today. -Continue foot and shoe inspections daily. Monitor blood glucose per PCP/Endocrinologist's recommendations. -Patient to continue soft, supportive shoe gear daily. -Mycotic toenails 1-5 bilaterally were debrided in length and girth with sterile nail nippers and dremel without incident. -Callus(es) bilateral great toes and  submet head 1 right foot pared utilizing sterile scalpel blade without complication or incident. Total number debrided =3. -Patient/POA to call should there be question/concern in the interim.  Return in about 3 months (around 12/18/2021).  Marzetta Board, DPM

## 2021-09-17 NOTE — Patient Instructions (Addendum)
Spray Lotrimin Antifungal Spray powder between toes once daily.

## 2021-09-24 DIAGNOSIS — R9389 Abnormal findings on diagnostic imaging of other specified body structures: Secondary | ICD-10-CM | POA: Diagnosis not present

## 2021-09-24 DIAGNOSIS — R062 Wheezing: Secondary | ICD-10-CM | POA: Diagnosis not present

## 2021-10-02 DIAGNOSIS — J209 Acute bronchitis, unspecified: Secondary | ICD-10-CM | POA: Diagnosis not present

## 2021-12-23 ENCOUNTER — Ambulatory Visit (INDEPENDENT_AMBULATORY_CARE_PROVIDER_SITE_OTHER): Payer: BC Managed Care – PPO | Admitting: Podiatry

## 2021-12-23 ENCOUNTER — Other Ambulatory Visit: Payer: Self-pay

## 2021-12-23 ENCOUNTER — Encounter: Payer: Self-pay | Admitting: Podiatry

## 2021-12-23 DIAGNOSIS — M79675 Pain in left toe(s): Secondary | ICD-10-CM

## 2021-12-23 DIAGNOSIS — M79674 Pain in right toe(s): Secondary | ICD-10-CM | POA: Diagnosis not present

## 2021-12-23 DIAGNOSIS — E1151 Type 2 diabetes mellitus with diabetic peripheral angiopathy without gangrene: Secondary | ICD-10-CM

## 2021-12-23 DIAGNOSIS — R9389 Abnormal findings on diagnostic imaging of other specified body structures: Secondary | ICD-10-CM | POA: Insufficient documentation

## 2021-12-23 DIAGNOSIS — B351 Tinea unguium: Secondary | ICD-10-CM | POA: Diagnosis not present

## 2021-12-23 DIAGNOSIS — L84 Corns and callosities: Secondary | ICD-10-CM

## 2021-12-29 NOTE — Progress Notes (Signed)
°  Subjective:  Patient ID: Manuel Romero, male    DOB: 12-11-1955,  MRN: [id number]  MERT DIETRICK presents to clinic today for at risk foot care. Pt has h/o NIDDM with PAD and callus(es) both feet and painful thick toenails that are difficult to trim. Painful toenails interfere with ambulation. Aggravating factors include wearing enclosed shoe gear. Pain is relieved with periodic professional debridement. Painful calluses are aggravated when weightbearing with and without shoegear. Pain is relieved with periodic professional debridement.  Patient did not check blood glucose today.  New problem(s): None.   PCP is [name], [place] , and last visit was October 02, 2021.  Allergies  Allergen Reactions   Chromium     Other reaction(s): dizziness   Wound Dressing Adhesive     Other reaction(s): rash Other reaction(s): rash    Review of Systems: Negative except as noted in the HPI. Objective:   Constitutional Manuel Romero is a pleasant 66 y.o. Caucasian male, WD, WN in NAD. AAO x 3.   Vascular CFT <3 seconds b/l LE. Faintly palpable pedal pulses b/l. Pedal hair absent. No pain with calf compression b/l. No edema noted b/l LE. Trace edema noted BLE. Varicosities present b/l. Evidence of chronic venous insufficiency b/l LE. No cyanosis or clubbing noted b/l LE.  Neurologic Normal speech. Oriented to person, place, and time. Protective sensation intact 5/5 intact bilaterally with 10g monofilament b/l. Vibratory sensation intact b/l.  Dermatologic Pedal integument with normal turgor, texture and tone BLE. No open wounds b/l LE. No interdigital macerations noted b/l LE. Toenails 1-5 b/l elongated, discolored, dystrophic, thickened, crumbly with subungual debris and tenderness to dorsal palpation. Hyperkeratotic lesion(s) L hallux, R hallux, and submet head 1 right foot.  No erythema, no edema, no drainage, no fluctuance.  Orthopedic: Muscle strength 5/5 to all lower extremity muscle groups  bilaterally. No pain, crepitus or joint limitation noted with ROM bilateral LE. HAV with bunion deformity noted b/l LE.   Radiographs: None  Last A1c:  Hemoglobin A1C Latest Ref Rng & Units 03/27/2021  HGBA1C 4.8 - 5.6 % 6.2(H)  Some recent data might be hidden   Assessment:   1. Pain due to onychomycosis of toenails of both feet   2. Callus   3. Type II diabetes mellitus with peripheral circulatory disorder [facility])    Plan:  Patient was evaluated and treated and all questions answered. Consent given for treatment as described below: -Toenails 1-5 bilaterally were debrided in length and girth with sterile nail nippers and dremel. Pinpoint bleeding of L hallux addressed with Lumicain Hemostatic Solution, cleansed with alcohol. triple antibiotic ointment applied. Patient instructed to apply Neosporin Cream once daily for 7 days. -Callus(es) bilateral great toes and submet head 1 right foot pared utilizing sterile scalpel blade without complication or incident. Total number debrided =3. -Patient/POA to call should there be question/concern in the interim.  Return in about 3 months (around 03/22/2022).  Marzetta Board, DPM

## 2022-03-07 DIAGNOSIS — I872 Venous insufficiency (chronic) (peripheral): Secondary | ICD-10-CM | POA: Diagnosis not present

## 2022-03-07 DIAGNOSIS — E1169 Type 2 diabetes mellitus with other specified complication: Secondary | ICD-10-CM | POA: Diagnosis not present

## 2022-03-07 DIAGNOSIS — Z125 Encounter for screening for malignant neoplasm of prostate: Secondary | ICD-10-CM | POA: Diagnosis not present

## 2022-03-07 DIAGNOSIS — E785 Hyperlipidemia, unspecified: Secondary | ICD-10-CM | POA: Diagnosis not present

## 2022-03-07 DIAGNOSIS — I10 Essential (primary) hypertension: Secondary | ICD-10-CM | POA: Diagnosis not present

## 2022-03-07 DIAGNOSIS — Z0000 Encounter for general adult medical examination without abnormal findings: Secondary | ICD-10-CM | POA: Diagnosis not present

## 2022-03-26 ENCOUNTER — Ambulatory Visit: Payer: BC Managed Care – PPO | Admitting: Podiatry

## 2022-03-26 ENCOUNTER — Ambulatory Visit (INDEPENDENT_AMBULATORY_CARE_PROVIDER_SITE_OTHER): Payer: BC Managed Care – PPO | Admitting: Podiatry

## 2022-03-26 DIAGNOSIS — B351 Tinea unguium: Secondary | ICD-10-CM | POA: Diagnosis not present

## 2022-03-26 DIAGNOSIS — M79674 Pain in right toe(s): Secondary | ICD-10-CM | POA: Diagnosis not present

## 2022-03-26 DIAGNOSIS — Z85820 Personal history of malignant melanoma of skin: Secondary | ICD-10-CM | POA: Insufficient documentation

## 2022-03-26 DIAGNOSIS — E1151 Type 2 diabetes mellitus with diabetic peripheral angiopathy without gangrene: Secondary | ICD-10-CM

## 2022-03-26 DIAGNOSIS — L84 Corns and callosities: Secondary | ICD-10-CM

## 2022-03-26 DIAGNOSIS — M79675 Pain in left toe(s): Secondary | ICD-10-CM | POA: Diagnosis not present

## 2022-03-30 ENCOUNTER — Encounter: Payer: Self-pay | Admitting: Podiatry

## 2022-03-30 NOTE — Progress Notes (Signed)
  Subjective:  Patient ID: Manuel Romero, male    DOB: 01/04/1956,  MRN: [id number]  CURVIN HUNGER presents to clinic today for at risk foot care with history of diabetic neuropathy and callus(es) b/l lower extremities and painful thick toenails that are difficult to trim. Painful toenails interfere with ambulation. Aggravating factors include wearing enclosed shoe gear. Pain is relieved with periodic professional debridement. Painful calluses are aggravated when weightbearing with and without shoegear. Pain is relieved with periodic professional debridement.  Patient states blood glucose was 118 mg/dl today.  Last known HgA1c was 6.1%.  New problem(s): None.   PCP is [name], [place] , and last visit was Mar 07, 2022.  Allergies  Allergen Reactions   Chromium     Other reaction(s): dizziness   Wound Dressing Adhesive     Other reaction(s): rash Other reaction(s): rash Other reaction(s): rash    Review of Systems: Negative except as noted in the HPI.  Objective: No changes noted in today's physical examination. Constitutional Manuel Romero is a pleasant 66 y.o. Caucasian male, WD, WN in NAD. AAO x 3.   Vascular CFT <3 seconds b/l LE. Faintly palpable pedal pulses b/l. Pedal hair absent. No pain with calf compression b/l. No edema noted b/l LE. Trace edema noted BLE. Varicosities present b/l. Evidence of chronic venous insufficiency b/l LE. No cyanosis or clubbing noted b/l LE.  Neurologic Normal speech. Oriented to person, place, and time. Protective sensation intact 5/5 intact bilaterally with 10g monofilament b/l. Vibratory sensation intact b/l.  Dermatologic Pedal integument with normal turgor, texture and tone BLE. No open wounds b/l LE. No interdigital macerations noted b/l LE. Toenails 1-5 b/l elongated, discolored, dystrophic, thickened, crumbly with subungual debris and tenderness to dorsal palpation. Hyperkeratotic lesion(s) L hallux, R hallux, and submet head 1 right foot.   No erythema, no edema, no drainage, no fluctuance.  Orthopedic: Muscle strength 5/5 to all lower extremity muscle groups bilaterally. No pain, crepitus or joint limitation noted with ROM bilateral LE. HAV with bunion deformity noted b/l LE.   Radiographs: None  Assessment/Plan: 1. Pain due to onychomycosis of toenails of both feet   2. Callus   3. Type II diabetes mellitus with peripheral circulatory disorder (HCC)     -Examined patient. -Patient to continue soft, supportive shoe gear daily. -Mycotic toenails 1-5 bilaterally were debrided in length and girth with sterile nail nippers and dremel without incident. -Iatrogenic laceration sustained during debridement of 2nd toe.  Treated with Lumicain Hemostatic Solution and alcohol. Patient instructed to apply Neosporin to 2nd toe once daily for 7 days. -Patient/POA to call should there be question/concern in the interim.   Return in about 3 months (around 06/26/2022).  Marzetta Board, DPM

## 2022-07-08 ENCOUNTER — Encounter: Payer: Self-pay | Admitting: Podiatry

## 2022-07-08 ENCOUNTER — Ambulatory Visit (INDEPENDENT_AMBULATORY_CARE_PROVIDER_SITE_OTHER): Payer: BC Managed Care – PPO | Admitting: Podiatry

## 2022-07-08 DIAGNOSIS — L84 Corns and callosities: Secondary | ICD-10-CM | POA: Diagnosis not present

## 2022-07-08 DIAGNOSIS — B351 Tinea unguium: Secondary | ICD-10-CM

## 2022-07-08 DIAGNOSIS — M79675 Pain in left toe(s): Secondary | ICD-10-CM

## 2022-07-08 DIAGNOSIS — E1151 Type 2 diabetes mellitus with diabetic peripheral angiopathy without gangrene: Secondary | ICD-10-CM

## 2022-07-08 DIAGNOSIS — M79674 Pain in right toe(s): Secondary | ICD-10-CM | POA: Diagnosis not present

## 2022-07-12 NOTE — Progress Notes (Signed)
  Subjective:  Patient ID: Manuel Romero, male    DOB: 1956-10-02,  MRN: [id number]  Manuel Romero presents to clinic today for at risk foot care. Pt has h/o NIDDM with PAD and corn(s) R 4th toe, callus(es) b/l lower extremities and painful mycotic nails.  Pain interferes with ambulation. Aggravating factors include wearing enclosed shoe gear. Painful toenails interfere with ambulation. Aggravating factors include wearing enclosed shoe gear. Pain is relieved with periodic professional debridement. Painful corns and calluses are aggravated when weightbearing with and without shoegear. Pain is relieved with periodic professional debridement.  Patient states blood glucose was 88 mg/dl today. Last known HgA1c was 6.2%.    New problem(s): None.   PCP is [name], [place] , and last visit was April, 2023.  Allergies  Allergen Reactions   Chromium     Other reaction(s): dizziness   Wound Dressing Adhesive     Other reaction(s): rash Other reaction(s): rash Other reaction(s): rash    Review of Systems: Negative except as noted in the HPI.  Objective: No changes noted in today's physical examination. Manuel Romero is a pleasant 66 y.o. male in NAD. AAO x 3. Vascular CFT <3 seconds b/l LE. Faintly palpable pedal pulses b/l. Pedal hair absent. No pain with calf compression b/l. No edema noted b/l LE. Trace edema noted BLE. Varicosities present b/l. Evidence of chronic venous insufficiency b/l LE. No cyanosis or clubbing noted b/l LE.  Neurologic Normal speech. Oriented to person, place, and time. Protective sensation intact 5/5 intact bilaterally with 10g monofilament b/l. Vibratory sensation intact b/l.  Dermatologic Pedal integument with normal turgor, texture and tone BLE. No open wounds b/l LE. + interdigital macerations noted b/l LE. Toenails 1-5 b/l elongated, discolored, dystrophic, thickened, crumbly with subungual debris and tenderness to dorsal palpation. Hyperkeratotic lesion(s) L  hallux, R hallux, and lateral aspect right 4th toe PIPJ.  No erythema, no edema, no drainage, no fluctuance.  Orthopedic: Muscle strength 5/5 to all lower extremity muscle groups bilaterally. No pain, crepitus or joint limitation noted with ROM bilateral LE. HAV with bunion deformity noted b/l LE.   Radiographs: None Assessment/Plan: 1. Pain due to onychomycosis of toenails of both feet   2. Corns and callosities   3. Type II diabetes mellitus with peripheral circulatory disorder [facility])     -Patient was evaluated and treated. All patient's and/or POA's questions/concerns answered on today's visit. -Toenails 1-5 b/l were debrided in length and girth with sterile nail nippers and dremel.  -Corn(s) R 4th toe and callus(es) medial PIPJ of R 4th toe were pared utilizing sterile scalpel blade. Light bleeding addressed with Lumicain and triple antibiotic ointment. Patient instructed to apply Neosporin toe once daily for one week. Total number debrided =2. -Patient/POA to call should there be question/concern in the interim.   Return in about 3 months (around 10/07/2022).  Marzetta Board, DPM

## 2022-09-11 DIAGNOSIS — I872 Venous insufficiency (chronic) (peripheral): Secondary | ICD-10-CM | POA: Diagnosis not present

## 2022-09-11 DIAGNOSIS — I10 Essential (primary) hypertension: Secondary | ICD-10-CM | POA: Diagnosis not present

## 2022-09-11 DIAGNOSIS — E785 Hyperlipidemia, unspecified: Secondary | ICD-10-CM | POA: Diagnosis not present

## 2022-09-11 DIAGNOSIS — Z23 Encounter for immunization: Secondary | ICD-10-CM | POA: Diagnosis not present

## 2022-09-11 DIAGNOSIS — E1169 Type 2 diabetes mellitus with other specified complication: Secondary | ICD-10-CM | POA: Diagnosis not present

## 2022-10-07 DIAGNOSIS — J069 Acute upper respiratory infection, unspecified: Secondary | ICD-10-CM | POA: Diagnosis not present

## 2022-10-13 ENCOUNTER — Ambulatory Visit: Payer: BC Managed Care – PPO | Admitting: Podiatry

## 2022-10-30 ENCOUNTER — Ambulatory Visit: Payer: BC Managed Care – PPO | Admitting: Podiatry

## 2022-10-30 ENCOUNTER — Ambulatory Visit (INDEPENDENT_AMBULATORY_CARE_PROVIDER_SITE_OTHER): Payer: Medicare Other | Admitting: Podiatry

## 2022-10-30 DIAGNOSIS — M79674 Pain in right toe(s): Secondary | ICD-10-CM | POA: Diagnosis not present

## 2022-10-30 DIAGNOSIS — E1151 Type 2 diabetes mellitus with diabetic peripheral angiopathy without gangrene: Secondary | ICD-10-CM

## 2022-10-30 DIAGNOSIS — M79675 Pain in left toe(s): Secondary | ICD-10-CM | POA: Diagnosis not present

## 2022-10-30 DIAGNOSIS — L84 Corns and callosities: Secondary | ICD-10-CM

## 2022-10-30 DIAGNOSIS — B351 Tinea unguium: Secondary | ICD-10-CM

## 2022-10-30 NOTE — Progress Notes (Signed)
  Subjective:  Patient ID: Manuel Romero, male    DOB: 12-Nov-1955,  MRN: [id number]  Chief Complaint  Patient presents with   ROUTINE FOOT CARE     67 y.o. male presents with the above complaint. History confirmed with patient. Patient presenting with pain related to dystrophic thickened elongated nails. Patient is unable to trim own nails related to nail dystrophy and/or mobility issues. Patient does have a history of T2DM.   Objective:  Physical Exam: warm, good capillary refill nail exam onychomycosis of the toenails DP pulses palpable, PT pulses palpable, and protective sensation absent Left Foot:  Pain with palpation of nails due to elongation and dystrophic growth.  Hyperkeratotic lesion present distal tuft of the left second toe with no underlying ulceration as well as the medial aspect of the left hallux IPJ.  No underlying ulceration Right Foot: Pain with palpation of nails due to elongation and dystrophic growth.  Hyperkeratotic lesion present at the distal tuft of the right second toe without underlying ulceration.  Assessment:   1. Pain due to onychomycosis of toenails of both feet   2. Corns and callosities   3. Type II diabetes mellitus with peripheral circulatory disorder [facility])      Plan:  Patient was evaluated and treated and all questions answered.  #Hyperkeratotic lesions/pre ulcerative calluses present distal tuft of bilateral 2nd toe, Medial aspect of the left hallux. All symptomatic hyperkeratoses x 3 separate lesions were safely debrided with a sterile #10 blade to patient's level of comfort without incident. We discussed preventative and palliative care of these lesions including supportive and accommodative shoegear, padding, prefabricated and custom molded accommodative orthoses, use of a pumice stone and lotions/creams daily.  #Onychomycosis with pain  -Nails palliatively debrided as below. -Educated on self-care  Procedure: Nail Debridement Rationale:  Pain Type of Debridement: manual, sharp debridement. Instrumentation: Nail nipper, rotary burr. Number of Nails: 10  No follow-ups on file.         Everitt Amber, DPM [facility]

## 2022-11-13 ENCOUNTER — Ambulatory Visit: Payer: BC Managed Care – PPO | Admitting: Podiatry

## 2023-01-07 DIAGNOSIS — C44319 Basal cell carcinoma of skin of other parts of face: Secondary | ICD-10-CM | POA: Diagnosis not present

## 2023-01-07 DIAGNOSIS — L570 Actinic keratosis: Secondary | ICD-10-CM | POA: Diagnosis not present

## 2023-01-07 DIAGNOSIS — L578 Other skin changes due to chronic exposure to nonionizing radiation: Secondary | ICD-10-CM | POA: Diagnosis not present

## 2023-01-07 DIAGNOSIS — Z85820 Personal history of malignant melanoma of skin: Secondary | ICD-10-CM | POA: Diagnosis not present

## 2023-01-07 DIAGNOSIS — D485 Neoplasm of uncertain behavior of skin: Secondary | ICD-10-CM | POA: Diagnosis not present

## 2023-01-07 DIAGNOSIS — Z85828 Personal history of other malignant neoplasm of skin: Secondary | ICD-10-CM | POA: Diagnosis not present

## 2023-01-07 DIAGNOSIS — C44519 Basal cell carcinoma of skin of other part of trunk: Secondary | ICD-10-CM | POA: Diagnosis not present

## 2023-02-09 ENCOUNTER — Ambulatory Visit: Payer: Medicare HMO | Admitting: Podiatry

## 2023-02-09 DIAGNOSIS — M79674 Pain in right toe(s): Secondary | ICD-10-CM | POA: Diagnosis not present

## 2023-02-09 DIAGNOSIS — E119 Type 2 diabetes mellitus without complications: Secondary | ICD-10-CM

## 2023-02-09 DIAGNOSIS — E1151 Type 2 diabetes mellitus with diabetic peripheral angiopathy without gangrene: Secondary | ICD-10-CM

## 2023-02-09 DIAGNOSIS — L84 Corns and callosities: Secondary | ICD-10-CM

## 2023-02-09 DIAGNOSIS — B351 Tinea unguium: Secondary | ICD-10-CM

## 2023-02-09 DIAGNOSIS — M2011 Hallux valgus (acquired), right foot: Secondary | ICD-10-CM | POA: Diagnosis not present

## 2023-02-09 DIAGNOSIS — M2012 Hallux valgus (acquired), left foot: Secondary | ICD-10-CM

## 2023-02-09 DIAGNOSIS — M79675 Pain in left toe(s): Secondary | ICD-10-CM | POA: Diagnosis not present

## 2023-02-12 ENCOUNTER — Encounter: Payer: Self-pay | Admitting: Podiatry

## 2023-02-12 NOTE — Progress Notes (Signed)
ANNUAL DIABETIC FOOT EXAM  Subjective: Manuel Romero presents today for annual diabetic foot examination.  Chief Complaint  Patient presents with   Nail Problem    [facility] [id number] A1C-6.3 PCP-Manuel Romero, Manuel Romero PCP VST-08/11/2022   Patient confirms h/o diabetes.  Patient denies any h/o foot wounds.  Patient has h/o foot ulcer of  right foot.  Risk factors: diabetes, HTN, hyperlipidemia.  Manuel Romero, Manuel Romero is patient's PCP.  Past Medical History:  Diagnosis Date   Basal cell carcinoma    Back, shoulder, chest, bil arm, and nose   HTN (hypertension)    Type 2 diabetes mellitus [facility])    Patient Active Problem List   Diagnosis Date Noted   Personal history of malignant melanoma of skin 03/26/2022   Standard chest x-ray abnormal 12/23/2021   Malignant melanoma of left forearm 05/03/2021   Allergic rhinitis 03/20/2021   Hyperlipidemia 03/20/2021   Obesity 03/20/2021   Peripheral venous insufficiency 03/20/2021   Pure hypercholesterolemia 03/20/2021   Type 2 diabetes mellitus 05/16/2018   HTN (hypertension) 05/16/2018   Past Surgical History:  Procedure Laterality Date   APPENDECTOMY  1964   BASAL CELL CARCINOMA EXCISION     Shoulder, Chest, Back, Bil Arms, R ear, and nose   EXCISION MELANOMA WITH SENTINEL LYMPH NODE BIOPSY N/A 04/04/2021   Procedure: WIDE LOCAL EXCISION, ADVANCED FLAP CLOSURE LEFT FOREARM MELANOMA WITH SENTINEL LYMPH NODE BIOPSY AND MAPPING;  Surgeon: Almond Lint, MD;  Location: [facility] OR;  Service: General;  Laterality: N/A;  GEN AND PEC BLOCK   EYE SURGERY Bilateral    cataracts   FINGER FRACTURE SURGERY Right    Right middle finger   WISDOM TOOTH EXTRACTION Bilateral    Current Outpatient Medications on File Prior to Visit  Medication Sig Dispense Refill   APPLE CIDER VINEGAR PO Take 1 tablet by mouth daily.     atorvastatin (LIPITOR) 20 MG tablet Take 20 mg by mouth daily.     metFORMIN (GLUCOPHAGE) 1000 MG tablet Take 1,000 mg by mouth 2 (two) times  daily with a meal.     Multiple Vitamin (MULTIVITAMINS PO) Take 1 tablet by mouth daily.     Multiple Vitamins-Minerals (AIRBORNE) TBEF Take 1 tablet by mouth daily.     NONFORMULARY OR COMPOUNDED ITEM [facility]:  Onychomycosis Nail Lacquer - Fluconazole 2%, Terbinafine 1%, DMSO, apply to affected area daily. 120 each 11   valsartan-hydrochlorothiazide (DIOVAN-HCT) 80-12.5 MG tablet Take 1 tablet by mouth daily.     No current facility-administered medications on file prior to visit.    Allergies  Allergen Reactions   Chromium     Other reaction(s): dizziness   Wound Dressing Adhesive     Other reaction(s): rash Other reaction(s): rash Other reaction(s): rash   Social History   Occupational History   Not on file  Tobacco Use   Smoking status: Never   Smokeless tobacco: Never  Vaping Use   Vaping Use: Never used  Substance and Sexual Activity   Alcohol use: Not Currently   Drug use: Never   Sexual activity: Yes   Family History  Problem Relation Age of Onset   Diabetes Brother    Neuropathy Brother    Immunization History  Administered Date(s) Administered   Influenza, Quadrivalent, Recombinant, Inj, Pf 09/01/2019, 08/03/2020   Influenza,inj,Quad PF,6+ Mos 08/25/2017, 10/04/2018   Td 06/15/2018   Tdap 02/07/2009     Review of Systems: Negative except as noted in the HPI.   Objective: There were  no vitals filed for this visit.  Manuel Romero is a pleasant 67 y.o. male in NAD. AAO X 3.  Vascular Examination: Capillary refill time to digits immediate b/l. Palpable DP pulse(s) b/l LE. Palpable PT pulse(s) b/l LE. Pedal hair absent. No pain with calf compression b/l. Lower extremity skin temperature gradient within normal limits. Trace edema noted BLE. Varicosities present b/l. Evidence of chronic venous insufficiency b/l LE. No ischemia or gangrene noted b/l LE. No cyanosis or clubbing noted b/l LE.  Dermatological Examination: Pedal skin is warm and supple  b/l LE. No open wounds b/l LE. No interdigital macerations noted b/l LE. Toenails 1-5 bilaterally elongated, discolored, dystrophic, thickened, and crumbly with subungual debris and tenderness to dorsal palpation. Hyperkeratotic lesion(s) medial IPJ of right great toe, submet head 2 left foot, and submet head 3 right foot.  No erythema, no edema, no drainage, no fluctuance.  Neurological Examination: Protective sensation intact 5/5 intact bilaterally with 10g monofilament b/l. Vibratory sensation intact b/l. Proprioception intact bilaterally.  Musculoskeletal Examination: Normal muscle strength 5/5 to all lower extremity muscle groups bilaterally. HAV with bunion deformity noted b/l LE.[name] No pain, crepitus or joint limitation noted with ROM b/l LE.  Patient ambulates independently without assistive aids.  Footwear Assessment: Does the patient wear appropriate shoes? Yes. Does the patient need inserts/orthotics? Yes.  Lab Results  Component Value Date   HGBA1C 6.2 (H) 03/27/2021   ADA Risk Categorization: High Risk  Patient has one or more of the following: Loss of protective sensation Absent pedal pulses Severe Foot deformity History of foot ulcer  Assessment: 1. Pain due to onychomycosis of toenails of both feet   2. Callus   3. Hallux valgus, acquired, bilateral   4. Type II diabetes mellitus with peripheral circulatory disorder   5. Encounter for diabetic foot exam      Plan: -Patient was evaluated and treated. All patient's and/or POA's questions/concerns answered on today's visit. -Diabetic foot examination performed today. -Continue foot and shoe inspections daily. Monitor blood glucose per PCP/Endocrinologist's recommendations. -Patient to continue soft, supportive shoe gear daily. -Toenails 1-5 b/l were debrided in length and girth with sterile nail nippers and dremel without iatrogenic bleeding.  -Callus(es) right great toe, submet head 2 left foot, and submet head 3  right foot pared utilizing sterile scalpel blade without complication or incident. Total number debrided =3. -Patient/POA to call should there be question/concern in the interim. Return in about 3 months (around 05/11/2023).  Freddie Breech, DPM

## 2023-02-17 DIAGNOSIS — C44319 Basal cell carcinoma of skin of other parts of face: Secondary | ICD-10-CM | POA: Diagnosis not present

## 2023-02-17 DIAGNOSIS — Z85820 Personal history of malignant melanoma of skin: Secondary | ICD-10-CM | POA: Diagnosis not present

## 2023-04-27 ENCOUNTER — Other Ambulatory Visit: Payer: Self-pay

## 2023-04-27 ENCOUNTER — Ambulatory Visit
Admission: RE | Admit: 2023-04-27 | Discharge: 2023-04-27 | Disposition: A | Discharge status: Home / Self Care | Payer: Medicare HMO | Source: Ambulatory Visit | Attending: Family Medicine | Admitting: Family Medicine

## 2023-04-27 ENCOUNTER — Ambulatory Visit (INDEPENDENT_AMBULATORY_CARE_PROVIDER_SITE_OTHER): Payer: Medicare HMO

## 2023-04-27 VITALS — BP 150/81 | HR 70 | Temp 98.1°F | Resp 20

## 2023-04-27 DIAGNOSIS — R29890 Loss of height: Secondary | ICD-10-CM | POA: Diagnosis not present

## 2023-04-27 DIAGNOSIS — M546 Pain in thoracic spine: Secondary | ICD-10-CM

## 2023-04-27 MED ORDER — METHOCARBAMOL 500 MG PO TABS: 500.0000 mg | ORAL_TABLET | Freq: Two times a day (BID) | ORAL | 0 refills | Status: DC | PRN

## 2023-04-27 NOTE — ED Provider Notes (Signed)
[facility]    CSN: [id number] Arrival date & time: 04/27/23  0946      History   Chief Complaint Chief Complaint  Patient presents with   Back Pain    Muscle spasms radiating from middle of back to neck and shoulders - Entered by patient    HPI Manuel Romero is a 67 y.o. male.    Back Pain  Here for thoracic back pain.  June 17 he fell from a ladder.  He was standing at about 3 feet above the ground when the ladder collapsed and he fell backwards falling onto the ladder and sitting down hard on his buttocks.  No loss of consciousness or head injury.  Right after that he had some bruising on his buttocks, but then by June 21 he was starting to note pain along his lower thoracic rib cage in the back.  Then after he had been doing some extra twisting and lifting activities with his job, he began hurting worse on June 27.  No fever or cough or rash.  No dysuria.   He has history of diabetes.  I cannot access any recent lab work, but last EGFR in epic was normal.  He did just have labs done in June with [facility] physicians.  Tylenol has been helping a good bit.  He does have some sensation of some muscle spasms that cause pain to radiate toward his upper back near his shoulders.   Past Medical History:  Diagnosis Date   Basal cell carcinoma    Back, shoulder, chest, bil arm, and nose   HTN (hypertension)    Type 2 diabetes mellitus [facility])     Patient Active Problem List   Diagnosis Date Noted   Personal history of malignant melanoma of skin 03/26/2022   Standard chest x-ray abnormal 12/23/2021   Malignant melanoma of left forearm (HCC) 05/03/2021   Allergic rhinitis 03/20/2021   Hyperlipidemia 03/20/2021   Obesity 03/20/2021   Peripheral venous insufficiency 03/20/2021   Pure hypercholesterolemia 03/20/2021   Type 2 diabetes mellitus (HCC) 05/16/2018   HTN (hypertension) 05/16/2018    Past Surgical History:  Procedure Laterality Date   APPENDECTOMY  1964    BASAL CELL CARCINOMA EXCISION     Shoulder, Chest, Back, Bil Arms, R ear, and nose   EXCISION MELANOMA WITH SENTINEL LYMPH NODE BIOPSY N/A 04/04/2021   Procedure: WIDE LOCAL EXCISION, ADVANCED FLAP CLOSURE LEFT FOREARM MELANOMA WITH SENTINEL LYMPH NODE BIOPSY AND MAPPING;  Surgeon: Almond Lint, MD;  Location: [facility] OR;  Service: General;  Laterality: N/A;  GEN AND PEC BLOCK   EYE SURGERY Bilateral    cataracts   FINGER FRACTURE SURGERY Right    Right middle finger   WISDOM TOOTH EXTRACTION Bilateral        Home Medications    Prior to Admission medications   Medication Sig Start Date End Date Taking? Authorizing Provider  APPLE CIDER VINEGAR PO Take 1 tablet by mouth daily.   Yes [provider]  atorvastatin (LIPITOR) 20 MG tablet Take 20 mg by mouth daily. 08/26/17  Yes [provider]  metFORMIN (GLUCOPHAGE) 1000 MG tablet Take 1,000 mg by mouth 2 (two) times daily with a meal.   Yes [provider]  methocarbamol (ROBAXIN) 500 MG tablet Take 1 tablet (500 mg total) by mouth 2 (two) times daily as needed for muscle spasms. 04/27/23  Yes Zenia Resides, MD  Multiple Vitamin (MULTIVITAMINS PO) Take 1 tablet by mouth daily.  Yes [provider]  Multiple Vitamins-Minerals (AIRBORNE) TBEF Take 1 tablet by mouth daily.   Yes [provider]  valsartan-hydrochlorothiazide (DIOVAN-HCT) 80-12.5 MG tablet Take 1 tablet by mouth daily.   Yes [provider]  NONFORMULARY OR COMPOUNDED ITEM [facility]:  Onychomycosis Nail Lacquer - Fluconazole 2%, Terbinafine 1%, DMSO, apply to affected area daily. 05/14/18   Freddie Breech, DPM    Family History Family History  Problem Relation Age of Onset   Diabetes Brother    Neuropathy Brother     Social History Social History   Tobacco Use   Smoking status: Never   Smokeless tobacco: Never  Vaping Use   Vaping Use: Never used  Substance Use Topics   Alcohol use: Not  Currently   Drug use: Never     Allergies   Chromium and Wound dressing adhesive   Review of Systems Review of Systems  Musculoskeletal:  Positive for back pain.     Physical Exam Triage Vital Signs ED Triage Vitals  Enc Vitals Group     BP 04/27/23 1011 (!) 150/81     Pulse Rate 04/27/23 1011 70     Resp 04/27/23 1011 20     Temp 04/27/23 1011 98.1 F (36.7 C)     Temp src --      SpO2 04/27/23 1011 98 %     Weight --      Height --      Head Circumference --      Peak Flow --      Pain Score 04/27/23 1006 7     Pain Loc --      Pain Edu? --      Excl. in GC? --    No data found.  Updated Vital Signs BP (!) 150/81   Pulse 70   Temp 98.1 F (36.7 C)   Resp 20   SpO2 98%   Visual Acuity Right Eye Distance:   Left Eye Distance:   Bilateral Distance:    Right Eye Near:   Left Eye Near:    Bilateral Near:     Physical Exam Vitals reviewed.  Constitutional:      General: He is not in acute distress.    Appearance: He is not ill-appearing, toxic-appearing or diaphoretic.  HENT:     Mouth/Throat:     Mouth: Mucous membranes are moist.  Eyes:     Extraocular Movements: Extraocular movements intact.     Conjunctiva/sclera: Conjunctivae normal.     Pupils: Pupils are equal, round, and reactive to light.  Cardiovascular:     Rate and Rhythm: Normal rate and regular rhythm.     Heart sounds: No murmur heard. Pulmonary:     Effort: Pulmonary effort is normal. No respiratory distress.     Breath sounds: Normal breath sounds. No stridor. No wheezing, rhonchi or rales.  Musculoskeletal:     Cervical back: Neck supple.     Comments: Chest and thoracic musculature are nontender.  Lymphadenopathy:     Cervical: No cervical adenopathy.  Skin:    Coloration: Skin is not jaundiced or pale.     Findings: No rash.  Neurological:     General: No focal deficit present.     Mental Status: He is alert and oriented to person, place, and time.  Psychiatric:         Behavior: Behavior normal.      UC Treatments / Results  Labs (all labs ordered are listed, but  only abnormal results are displayed) Labs Reviewed - No data to display  EKG   Radiology DG Chest 2 View  Result Date: 04/27/2023 CLINICAL DATA:  Bilateral lower thoracic pain radiating upward last 10 days. Thoracic and fall from a ladder 04/13/2023, falling onto buttocks. EXAM: CHEST - 2 VIEW COMPARISON:  Chest radiographs 09/24/2021 (multiple studies) FINDINGS: Cardiac silhouette and mediastinal contours are within normal limits. The lungs are clear. No pleural effusion or pneumothorax. There is mild height loss of two adjacent (approximately T7 and T8) mid to lower thoracic vertebral bodies that appears stable to mildly worsened from prior. Moderate height loss of an inferior approximate T11 vertebral body appears new from 09/24/2021. IMPRESSION: 1. No acute cardiopulmonary process. 2. Mild height loss of two adjacent (approximately T7 and T8) mid to lower thoracic vertebral bodies appears stable to mildly worsened from 09/24/2021. Moderate height loss of an inferior approximate T11 vertebral body appears new from 09/24/2021. These otherwise age indeterminate. Recommend clinical correlation for point tenderness. Electronically Signed   By: Neita Garnet M.D.   On: 04/27/2023 10:40    Procedures Procedures (including critical care time)  Medications Ordered in UC Medications - No data to display  Initial Impression / Assessment and Plan / UC Course  I have reviewed the triage vital signs and the nursing notes.  Pertinent labs & imaging results that were available during my care of the patient were reviewed by me and considered in my medical decision making (see chart for details).        X-rays show some loss of height of several thoracic vertebrae by, compared to 2 yrs ago.   Continue tylenol as needed; methocarbamol sent in as a muscle relaxer. Final Clinical Impressions(s) / UC  Diagnoses   Final diagnoses:  Acute bilateral thoracic back pain     Discharge Instructions      The chest x-ray did not show any acute fractures.  There were no broken bones of the ribs.  There were 3 thoracic vertebra that had lost some height, but that is probably more of a chronic change she had.  Continue the Tylenol 500 mg--2 every 6 hours as needed for pain.  Methocarbamol 500 mg--1 tablet 2 times daily as needed for muscle spasms.  This medication can make you sleepy or dizzy  Please follow-up with your primary care doctor       ED Prescriptions     Medication Sig Dispense Auth. Provider   methocarbamol (ROBAXIN) 500 MG tablet Take 1 tablet (500 mg total) by mouth 2 (two) times daily as needed for muscle spasms. 20 tablet Jovahn Breit, Janace Aris, MD      PDMP not reviewed this encounter.   Zenia Resides, MD 04/27/23 1057

## 2023-04-27 NOTE — ED Triage Notes (Signed)
Pt reports on 04-13-23 Pt fell off a ladder and started having back discomfort on 04-17-23 . Pt did not go back to work till 04-20-23. Pt reports he has back twinges . Pain goes up his back to shoulder blades.

## 2023-04-27 NOTE — Discharge Instructions (Addendum)
The chest x-ray did not show any acute fractures.  There were no broken bones of the ribs.  There were 3 thoracic vertebra that had lost some height, but that is probably more of a chronic change she had.  Continue the Tylenol 500 mg--2 every 6 hours as needed for pain.  Methocarbamol 500 mg--1 tablet 2 times daily as needed for muscle spasms.  This medication can make you sleepy or dizzy  Please follow-up with your primary care doctor

## 2023-05-01 DIAGNOSIS — S39012A Strain of muscle, fascia and tendon of lower back, initial encounter: Secondary | ICD-10-CM | POA: Diagnosis not present

## 2023-06-24 ENCOUNTER — Ambulatory Visit: Payer: Medicare HMO | Admitting: Podiatry

## 2023-06-24 ENCOUNTER — Encounter: Payer: Self-pay | Admitting: Podiatry

## 2023-06-24 DIAGNOSIS — B351 Tinea unguium: Secondary | ICD-10-CM

## 2023-06-24 DIAGNOSIS — M79675 Pain in left toe(s): Secondary | ICD-10-CM | POA: Diagnosis not present

## 2023-06-24 DIAGNOSIS — M79674 Pain in right toe(s): Secondary | ICD-10-CM | POA: Diagnosis not present

## 2023-06-24 DIAGNOSIS — I83013 Varicose veins of right lower extremity with ulcer of ankle: Secondary | ICD-10-CM

## 2023-06-24 DIAGNOSIS — E1151 Type 2 diabetes mellitus with diabetic peripheral angiopathy without gangrene: Secondary | ICD-10-CM

## 2023-06-24 DIAGNOSIS — L84 Corns and callosities: Secondary | ICD-10-CM | POA: Diagnosis not present

## 2023-06-24 DIAGNOSIS — L97311 Non-pressure chronic ulcer of right ankle limited to breakdown of skin: Secondary | ICD-10-CM | POA: Diagnosis not present

## 2023-06-27 NOTE — Progress Notes (Signed)
  Subjective:  Patient ID: Manuel Romero, male    DOB: 09/30/1956,  MRN: [id number]  JONTAE SCHAD presents to clinic today for at risk foot care. Patient has history of NIDDM and PVD and callus(es) of both feet and painful thick toenails that are difficult to trim. Painful toenails interfere with ambulation. Aggravating factors include wearing enclosed shoe gear. Pain is relieved with periodic professional debridement. Painful calluses are aggravated when weightbearing with and without shoegear. Pain is relieved with periodic professional debridement. Patient is wearing compression hose daily. Chief Complaint  Patient presents with   Nail Problem    DFC,A1C:6.2,Referring Provider Wilfrid Lund, PA,lov:07/24,BS:115      New problem(s): None.   PCP is Wilfrid Lund, [place].  Allergies  Allergen Reactions   Chromium     Other reaction(s): dizziness   Wound Dressing Adhesive     Other reaction(s): rash Other reaction(s): rash Other reaction(s): rash    Review of Systems: Negative except as noted in the HPI.  Objective: No changes noted in today's physical examination. There were no vitals filed for this visit. DONATELLO Romero is a pleasant 67 y.o. male WD, WN in NAD. AAO x 3.  Vascular Examination: Capillary refill time to digits immediate b/l. Palpable DP pulse(s) b/l LE. Palpable PT pulse(s) b/l LE. Pedal hair absent. No pain with calf compression b/l. Lower extremity skin temperature gradient within normal limits. Trace edema noted BLE. Varicosities present b/l. Evidence of chronic venous insufficiency b/l LE. No ischemia or gangrene noted b/l LE. No cyanosis or clubbing noted b/l LE.  Dermatological Examination: Small ulceration dorsomedial aspect right lower mid-leg. Annular in shape, superficial. No erythema, no edema, no drainage, no fluctuance.  Early venous stasis ulceration medial aspect right ankle.             Pedal skin is warm and supple b/l LE. No  interdigital macerations noted b/l LE.   Toenails 1-5 bilaterally elongated, discolored, dystrophic, thickened, and crumbly with subungual debris and tenderness to dorsal palpation.   Hyperkeratotic lesion(s) medial IPJ of right great toe, submet head 2 left foot, and submet head 3 right foot.  No erythema, no edema, no drainage, no fluctuance.  Neurological Examination: Protective sensation intact 5/5 intact bilaterally with 10g monofilament b/l. Vibratory sensation intact b/l. Proprioception intact bilaterally.  Musculoskeletal Examination: Normal muscle strength 5/5 to all lower extremity muscle groups bilaterally. HAV with bunion deformity noted b/l LE.[name] No pain, crepitus or joint limitation noted with ROM b/l LE.  Patient ambulates independently without assistive aids.  Assessment/Plan: 1. Pain due to onychomycosis of toenails of both feet   2. Callus   3. Peripheral venous insufficiency   4. Type II diabetes mellitus with peripheral circulatory disorder (HCC)     -Consent given for treatment as described below: -Examined patient. -For right ankle venous stasis ulcer, recommended patient follow up with Vascular Surgery. -Continue foot and shoe inspections daily. Monitor blood glucose per PCP/Endocrinologist's recommendations. -Toenails 1-5 b/l were debrided in length and girth with sterile nail nippers and dremel without iatrogenic bleeding.  -Callus(es) R hallux, submet head 2 left foot, and submet head 3 right foot pared utilizing sterile scalpel blade without complication or incident. Total number debrided =3. -Patient/POA to call should there be question/concern in the interim.   No follow-ups on file.  Manuel Romero, DPM

## 2023-09-28 ENCOUNTER — Encounter: Payer: Self-pay | Admitting: Podiatry

## 2023-09-28 ENCOUNTER — Ambulatory Visit (INDEPENDENT_AMBULATORY_CARE_PROVIDER_SITE_OTHER): Payer: Medicare HMO | Admitting: Podiatry

## 2023-09-28 DIAGNOSIS — E1151 Type 2 diabetes mellitus with diabetic peripheral angiopathy without gangrene: Secondary | ICD-10-CM

## 2023-09-28 DIAGNOSIS — B351 Tinea unguium: Secondary | ICD-10-CM | POA: Diagnosis not present

## 2023-09-28 DIAGNOSIS — M79674 Pain in right toe(s): Secondary | ICD-10-CM | POA: Diagnosis not present

## 2023-09-28 DIAGNOSIS — M79675 Pain in left toe(s): Secondary | ICD-10-CM

## 2023-09-28 DIAGNOSIS — L84 Corns and callosities: Secondary | ICD-10-CM | POA: Diagnosis not present

## 2023-09-28 NOTE — Progress Notes (Signed)
  Subjective:  Patient ID: Manuel Romero, male    DOB: 05-14-1956,  MRN: [id number]  Manuel Romero presents to clinic today for at risk foot care. Patient has history of NIDDM and PVD and callus(es) of both feet and painful thick toenails that are difficult to trim. Painful toenails interfere with ambulation. Aggravating factors include wearing enclosed shoe gear. Pain is relieved with periodic professional debridement. Painful calluses are aggravated when weightbearing with and without shoegear. Pain is relieved with periodic professional debridement. Patient is wearing compression hose daily. No chief complaint on file.  New problem(s): None.   PCP is Wilfrid Lund, [place].  Allergies  Allergen Reactions   Chromium     Other reaction(s): dizziness   Wound Dressing Adhesive     Other reaction(s): rash Other reaction(s): rash Other reaction(s): rash    Review of Systems: Negative except as noted in the HPI.  Objective: No changes noted in today's physical examination. There were no vitals filed for this visit. Manuel Romero is a pleasant 67 y.o. male WD, WN in NAD. AAO x 3.  Vascular Examination: Capillary refill time to digits immediate b/l. Palpable DP pulse(s) b/l LE. Palpable PT pulse(s) b/l LE. Pedal hair absent. No pain with calf compression b/l. Lower extremity skin temperature gradient within normal limits. Trace edema noted BLE. Varicosities present b/l. Evidence of chronic venous insufficiency b/l LE. No ischemia or gangrene noted b/l LE. No cyanosis or clubbing noted b/l LE.  Dermatological Examination: Small ulceration dorsomedial aspect right lower mid-leg. Annular in shape, superficial. No erythema, no edema, no drainage, no fluctuance.  Early venous stasis ulceration medial aspect right ankle.   Pedal skin is warm and supple b/l LE. No interdigital macerations noted b/l LE.   Toenails 1-5 bilaterally elongated, discolored, dystrophic, thickened, and crumbly with  subungual debris and tenderness to dorsal palpation.   Hyperkeratotic lesion(s) medial IPJ of right great toe, submet head 2 left foot, and submet head 3 right foot.  No erythema, no edema, no drainage, no fluctuance.  Neurological Examination: Protective sensation intact 5/5 intact bilaterally with 10g monofilament b/l. Vibratory sensation intact b/l. Proprioception intact bilaterally.  Musculoskeletal Examination: Normal muscle strength 5/5 to all lower extremity muscle groups bilaterally. HAV with bunion deformity noted b/l LE.[name] No pain, crepitus or joint limitation noted with ROM b/l LE.  Patient ambulates independently without assistive aids.  Assessment/Plan: 1. Pain due to onychomycosis of toenails of both feet   2. Callus   3. Type II diabetes mellitus with peripheral circulatory disorder (HCC)     -Consent given for treatment as described below: -Examined patient. -For right ankle venous stasis ulcer, recommended patient follow up with Vascular Surgery. -Continue foot and shoe inspections daily. Monitor blood glucose per PCP/Endocrinologist's recommendations. -Toenails 1-5 b/l were debrided in length and girth with sterile nail nippers and dremel without iatrogenic bleeding.  -Callus(es) R hallux, submet head 2 left foot, and submet head 3 right foot pared utilizing sterile scalpel blade without complication or incident. Total number debrided =3. -Patient/POA to call should there be question/concern in the interim.   No follow-ups on file.  Louann Sjogren, DPM

## 2023-10-05 DIAGNOSIS — J069 Acute upper respiratory infection, unspecified: Secondary | ICD-10-CM | POA: Diagnosis not present

## 2023-12-28 ENCOUNTER — Encounter: Payer: Self-pay | Admitting: Podiatry

## 2023-12-28 ENCOUNTER — Ambulatory Visit: Payer: Medicare HMO | Admitting: Podiatry

## 2023-12-28 VITALS — Ht 76.0 in | Wt 231.0 lb

## 2023-12-28 DIAGNOSIS — E1151 Type 2 diabetes mellitus with diabetic peripheral angiopathy without gangrene: Secondary | ICD-10-CM

## 2023-12-28 DIAGNOSIS — M79674 Pain in right toe(s): Secondary | ICD-10-CM | POA: Diagnosis not present

## 2023-12-28 DIAGNOSIS — M79675 Pain in left toe(s): Secondary | ICD-10-CM

## 2023-12-28 DIAGNOSIS — B351 Tinea unguium: Secondary | ICD-10-CM | POA: Diagnosis not present

## 2023-12-28 DIAGNOSIS — L84 Corns and callosities: Secondary | ICD-10-CM | POA: Diagnosis not present

## 2023-12-28 NOTE — Progress Notes (Signed)
  Subjective:  Patient ID: Manuel Romero, male    DOB: [birth year],  MRN: [id number]  Manuel Romero presents to clinic today for at risk foot care. Patient has history of NIDDM and PVD and callus(es) of both feet and painful thick toenails that are difficult to trim. Painful toenails interfere with ambulation. Aggravating factors include wearing enclosed shoe gear. Pain is relieved with periodic professional debridement. Painful calluses are aggravated when weightbearing with and without shoegear. Pain is relieved with periodic professional debridement. Patient is wearing compression hose daily. Chief Complaint  Patient presents with   RFC   New problem(s): None.   PCP is Turmel, Caleb P, PA.  Allergies  Allergen Reactions   Chromium     Other reaction(s): dizziness   Wound Dressing Adhesive     Other reaction(s): rash Other reaction(s): rash Other reaction(s): rash    Review of Systems: Negative except as noted in the HPI.  Objective: No changes noted in today's physical examination. There were no vitals filed for this visit. Manuel Romero is a pleasant 68 y.o. male WD, WN in NAD. AAO x 3.  Vascular Examination: Capillary refill time to digits immediate b/l. Palpable DP pulse(s) b/l LE. Palpable PT pulse(s) b/l LE. Pedal hair absent. No pain with calf compression b/l. Lower extremity skin temperature gradient within normal limits. Trace edema noted BLE. Varicosities present b/l. Evidence of chronic venous insufficiency b/l LE. No ischemia or gangrene noted b/l LE. No cyanosis or clubbing noted b/l LE.  Dermatological Examination: Small ulceration dorsomedial aspect right lower mid-leg. Annular in shape, superficial. No erythema, no edema, no drainage, no fluctuance.  Early venous stasis ulceration medial aspect right ankle.   Pedal skin is warm and supple b/l LE. No interdigital macerations noted b/l LE.   Toenails 1-5 bilaterally elongated, discolored, dystrophic, thickened,  and crumbly with subungual debris and tenderness to dorsal palpation.   Hyperkeratotic lesion(s) medial IPJ of right great toe, submet head 2 left foot, and submet head 3 right foot.  No erythema, no edema, no drainage, no fluctuance.  Neurological Examination: Protective sensation intact 5/5 intact bilaterally with 10g monofilament b/l. Vibratory sensation intact b/l. Proprioception intact bilaterally.  Musculoskeletal Examination: Normal muscle strength 5/5 to all lower extremity muscle groups bilaterally. HAV with bunion deformity noted b/l LE.[name] No pain, crepitus or joint limitation noted with ROM b/l LE.  Patient ambulates independently without assistive aids.  Assessment/Plan: 1. Pain due to onychomycosis of toenails of both feet   2. Callus   3. Type II diabetes mellitus with peripheral circulatory disorder (HCC)     -Consent given for treatment as described below: -Examined patient. -For right ankle venous stasis ulcer, recommended patient follow up with Vascular Surgery. -Continue foot and shoe inspections daily. Monitor blood glucose per PCP/Endocrinologist's recommendations. -Toenails 1-5 b/l were debrided in length and girth with sterile nail nippers and dremel without iatrogenic bleeding.  -Callus(es) R hallux, submet head 2 left foot, and submet head 3 right foot pared utilizing sterile scalpel blade without complication or incident. Total number debrided =3. -Patient/POA to call should there be question/concern in the interim.   No follow-ups on file.  Louann Sjogren, DPM

## 2024-03-29 ENCOUNTER — Ambulatory Visit (INDEPENDENT_AMBULATORY_CARE_PROVIDER_SITE_OTHER): Payer: Medicare HMO | Admitting: Podiatry

## 2024-03-29 ENCOUNTER — Encounter: Payer: Self-pay | Admitting: Podiatry

## 2024-03-29 DIAGNOSIS — M79674 Pain in right toe(s): Secondary | ICD-10-CM

## 2024-03-29 DIAGNOSIS — M2011 Hallux valgus (acquired), right foot: Secondary | ICD-10-CM | POA: Diagnosis not present

## 2024-03-29 DIAGNOSIS — Z0189 Encounter for other specified special examinations: Secondary | ICD-10-CM

## 2024-03-29 DIAGNOSIS — Z8679 Personal history of other diseases of the circulatory system: Secondary | ICD-10-CM

## 2024-03-29 DIAGNOSIS — M2012 Hallux valgus (acquired), left foot: Secondary | ICD-10-CM | POA: Diagnosis not present

## 2024-03-29 DIAGNOSIS — M79675 Pain in left toe(s): Secondary | ICD-10-CM | POA: Diagnosis not present

## 2024-03-29 DIAGNOSIS — E119 Type 2 diabetes mellitus without complications: Secondary | ICD-10-CM

## 2024-03-29 DIAGNOSIS — B351 Tinea unguium: Secondary | ICD-10-CM | POA: Diagnosis not present

## 2024-03-29 DIAGNOSIS — E1151 Type 2 diabetes mellitus with diabetic peripheral angiopathy without gangrene: Secondary | ICD-10-CM | POA: Diagnosis not present

## 2024-04-03 ENCOUNTER — Encounter: Payer: Self-pay | Admitting: Podiatry

## 2024-04-03 NOTE — Progress Notes (Signed)
 ANNUAL DIABETIC FOOT EXAM  Subjective: Manuel Romero presents today for annual diabetic foot exam. Chief Complaint  Patient presents with   Diabetes    "Cut my toenails."  Dr. Caleb Turmel - need to schedule an appt. - new dr.; Glucose today - 103 mg/dl    Patient confirms h/o diabetes.  Patient denies any h/o foot wounds.  Patient has h/o venous stasis ulceration.  Patient has been diagnosed with neuropathy.  Turmel, Caleb P, PA is patient's PCP.  Past Medical History:  Diagnosis Date   Basal cell carcinoma    Back, shoulder, chest, bil arm, and nose   HTN (hypertension)    Type 2 diabetes mellitus [facility])    Patient Active Problem List   Diagnosis Date Noted   Personal history of malignant melanoma of skin 03/26/2022   Standard chest x-ray abnormal 12/23/2021   Malignant melanoma of left forearm (HCC) 05/03/2021   Allergic rhinitis 03/20/2021   Hyperlipidemia 03/20/2021   Obesity 03/20/2021   Peripheral venous insufficiency 03/20/2021   Pure hypercholesterolemia 03/20/2021   Type 2 diabetes mellitus (HCC) 05/16/2018   HTN (hypertension) 05/16/2018   Past Surgical History:  Procedure Laterality Date   APPENDECTOMY  1964   BASAL CELL CARCINOMA EXCISION     Shoulder, Chest, Back, Bil Arms, R ear, and nose   EXCISION MELANOMA WITH SENTINEL LYMPH NODE BIOPSY N/A 04/04/2021   Procedure: WIDE LOCAL EXCISION, ADVANCED FLAP CLOSURE LEFT FOREARM MELANOMA WITH SENTINEL LYMPH NODE BIOPSY AND MAPPING;  Surgeon: Lockie Rima, MD;  Location: [facility] OR;  Service: General;  Laterality: N/A;  GEN AND PEC BLOCK   EYE SURGERY Bilateral    cataracts   FINGER FRACTURE SURGERY Right    Right middle finger   WISDOM TOOTH EXTRACTION Bilateral    Current Outpatient Medications on File Prior to Visit  Medication Sig Dispense Refill   APPLE CIDER VINEGAR PO Take 1 tablet by mouth daily.     atorvastatin (LIPITOR) 20 MG tablet Take 20 mg by mouth daily.     metFORMIN (GLUCOPHAGE) 1000 MG  tablet Take 1,000 mg by mouth 2 (two) times daily with a meal.     methocarbamol  (ROBAXIN ) 500 MG tablet Take 1 tablet (500 mg total) by mouth 2 (two) times daily as needed for muscle spasms. 20 tablet 0   Multiple Vitamin (MULTIVITAMINS PO) Take 1 tablet by mouth daily.     Multiple Vitamins-Minerals (AIRBORNE) TBEF Take 1 tablet by mouth daily.     NONFORMULARY OR COMPOUNDED ITEM [facility]:  Onychomycosis Nail Lacquer - Fluconazole 2%, Terbinafine 1%, DMSO, apply to affected area daily. 120 each 11   valsartan-hydrochlorothiazide (DIOVAN-HCT) 80-12.5 MG tablet Take 1 tablet by mouth daily.     No current facility-administered medications on file prior to visit.    Allergies  Allergen Reactions   Chromium     Other reaction(s): dizziness   Wound Dressing Adhesive     Other reaction(s): rash Other reaction(s): rash Other reaction(s): rash   Social History   Occupational History   Not on file  Tobacco Use   Smoking status: Never   Smokeless tobacco: Never  Vaping Use   Vaping status: Never Used  Substance and Sexual Activity   Alcohol use: Not Currently   Drug use: Never   Sexual activity: Yes   Family History  Problem Relation Age of Onset   Diabetes Brother    Neuropathy Brother    Immunization History  Administered Date(s) Administered  Influenza, Quadrivalent, Recombinant, Inj, Pf 09/01/2019, 08/03/2020   Influenza,inj,Quad PF,6+ Mos 08/25/2017, 10/04/2018   PNEUMOCOCCAL CONJUGATE-20 09/11/2022   Td 06/15/2018   Tdap 02/07/2009     Review of Systems: Negative except as noted in the HPI.   Objective: There were no vitals filed for this visit.  Manuel Romero is a pleasant 68 y.o. male in NAD. AAO X 3.  Diabetic foot exam was performed with the following findings:   Vascular Examination: Capillary refill time immediate b/l. Vascular status intact b/l with palpable pedal pulses. Pedal hair present b/l. No pain with calf compression b/l. Skin temperature  gradient WNL b/l. No cyanosis or clubbing b/l. No ischemia or gangrene noted b/l. Evidence of skin changes consistent with long term venous stasis BLE.  Neurological Examination: Sensation grossly intact b/l with 10 gram monofilament. Vibratory sensation intact b/l.   Dermatological Examination: Pedal skin with normal turgor, texture and tone b/l.  No open wounds. No interdigital macerations.   Toenails 1-5 b/l thick, discolored, elongated with subungual debris and pain on dorsal palpation.   No hyperkeratotic nor porokeratotic lesions present on today's visit.  Musculoskeletal Examination: Muscle strength 5/5 to all lower extremity muscle groups bilaterally. HAV with bunion deformity noted b/l LE.  Radiographs: None     Lab Results  Component Value Date   HGBA1C 6.2 (H) 03/27/2021   ADA Risk Categorization: High Risk  Patient has one or more of the following: Loss of protective sensation Absent pedal pulses Severe Foot deformity History of foot ulcer  Assessment: 1. Pain due to onychomycosis of toenails of both feet   2. Hallux valgus, acquired, bilateral   3. History of venous stasis ulcer of lower extremity   4. Type II diabetes mellitus with peripheral circulatory disorder (HCC)   5. Encounter for diabetic foot exam [facility])     Plan: Consent given for treatment. Diabetic foot examination performed.. All patient's and/or POA's questions/concerns addressed on today's visit. Mycotic toenails 1-5 debrided in length and girth without incident. Continue foot and shoe inspections daily. Monitor blood glucose per PCP/Endocrinologist's recommendations.Continue soft, supportive shoe gear daily. Report any pedal injuries to medical professional. Call office if there are any quesitons/concerns. -Patient/POA to call should there be question/concern in the interim. Return in about 3 months (around 06/29/2024).  Luella Sager, DPM       LOCATION: [address] [place] [address]                   Office [phone]   [facility] LOCATION: [address] [place], [place] [address] Office [phone]

## 2024-04-05 ENCOUNTER — Ambulatory Visit: Admitting: Podiatry

## 2024-07-12 ENCOUNTER — Ambulatory Visit: Admitting: Podiatry

## 2024-07-12 ENCOUNTER — Encounter: Payer: Self-pay | Admitting: Podiatry

## 2024-07-12 DIAGNOSIS — M79675 Pain in left toe(s): Secondary | ICD-10-CM

## 2024-07-12 DIAGNOSIS — M79674 Pain in right toe(s): Secondary | ICD-10-CM | POA: Diagnosis not present

## 2024-07-12 DIAGNOSIS — E1151 Type 2 diabetes mellitus with diabetic peripheral angiopathy without gangrene: Secondary | ICD-10-CM | POA: Diagnosis not present

## 2024-07-12 DIAGNOSIS — B351 Tinea unguium: Secondary | ICD-10-CM | POA: Diagnosis not present

## 2024-07-17 NOTE — Progress Notes (Signed)
  Subjective:  Patient ID: Manuel Romero, male    DOB: 04-28-1956,  MRN: [id number]  DANIL WEDGE presents to clinic today for at risk foot care. Pt has h/o NIDDM with PAD and painful thick toenails that are difficult to trim. Pain interferes with ambulation. Aggravating factors include wearing enclosed shoe gear. Pain is relieved with periodic professional debridement.   New problem(s): None.   PCP is Turmel, Caleb P, PA. LOV 02/16/2024.  Allergies  Allergen Reactions   Chromium     Other reaction(s): dizziness   Wound Dressing Adhesive     Other reaction(s): rash Other reaction(s): rash Other reaction(s): rash    Review of Systems: Negative except as noted in the HPI.  Objective: No changes noted in today's physical examination. There were no vitals filed for this visit. DEMARRIUS GUERRERO is a pleasant 68 y.o. male in NAD. AAO x 3.  Vascular Examination: Capillary refill time immediate b/l. Vascular status intact b/l with palpable pedal pulses. Pedal hair present b/l. No pain with calf compression b/l. Skin temperature gradient WNL b/l. No cyanosis or clubbing b/l. No ischemia or gangrene noted b/l. Evidence of skin changes consistent with long term venous stasis BLE.  Neurological Examination: Sensation grossly intact b/l with 10 gram monofilament. Vibratory sensation intact b/l.   Dermatological Examination: Pedal skin with normal turgor, texture and tone b/l.  No open wounds. No interdigital macerations.   Toenails 1-5 b/l thick, discolored, elongated with subungual debris and pain on dorsal palpation.   No hyperkeratotic nor porokeratotic lesions present on today's visit.  Musculoskeletal Examination: Muscle strength 5/5 to all lower extremity muscle groups bilaterally. HAV with bunion deformity noted b/l LE.  Radiographs: None  Assessment/Plan: 1. Pain due to onychomycosis of toenails of both feet   2. Type II diabetes mellitus with peripheral circulatory disorder  [facility])   Patient was evaluated and treated. All patient's and/or POA's questions/concerns addressed on today's visit. Toenails 1-5 debrided in length and girth. Light bleeding left 5th toe addressed with Lumicain Hemostatic Solution. Triple antibiotic ointment applied. He is to apply Neosporin once daily for one week. Continue foot and shoe inspections daily. Monitor blood glucose per PCP/Endocrinologist's recommendations. Continue soft, supportive shoe gear daily. Report any pedal injuries to medical professional. Call office if there are any questions/concerns.  Return in about 3 months (around 10/11/2024).  Delon LITTIE Merlin, DPM      [place] LOCATION: [address] [place] [address]                   Office [phone]   [facility] LOCATION: [address] [place], [place] [address] Office [phone]

## 2024-10-16 ENCOUNTER — Ambulatory Visit
Admission: EM | Admit: 2024-10-16 | Discharge: 2024-10-16 | Disposition: A | Discharge status: Home / Self Care | Attending: Emergency Medicine | Admitting: Emergency Medicine

## 2024-10-16 ENCOUNTER — Ambulatory Visit: Payer: Self-pay

## 2024-10-16 DIAGNOSIS — R0981 Nasal congestion: Secondary | ICD-10-CM

## 2024-10-16 DIAGNOSIS — J209 Acute bronchitis, unspecified: Secondary | ICD-10-CM | POA: Diagnosis not present

## 2024-10-16 MED ORDER — PREDNISONE 20 MG PO TABS: 40.0000 mg | ORAL_TABLET | Freq: Every day | ORAL | 0 refills | Status: AC

## 2024-10-16 MED ORDER — PROMETHAZINE-DM 6.25-15 MG/5ML PO SYRP: 5.0000 mL | ORAL_SOLUTION | Freq: Four times a day (QID) | ORAL | 0 refills | Status: AC | PRN

## 2024-10-16 MED ORDER — IPRATROPIUM BROMIDE 0.06 % NA SOLN: 2.0000 | Freq: Four times a day (QID) | NASAL | 0 refills | Status: AC

## 2024-10-16 NOTE — ED Triage Notes (Signed)
 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   Pt declines a covid test and states that he believes he has bronchitis

## 2024-10-16 NOTE — ED Provider Notes (Signed)
 " HPI  SUBJECTIVE:  Manuel Romero is a 68 y.o. male who presents with 3 days of a cough productive of greenish mucus, nasal congestion, rhinorrhea, postnasal drip and wheezing.  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  States that he thinks that he inhaled some leaf debris.  No fevers, body aches, headaches, chest pain, shortness of breath, dyspnea on exertion, sinus pain or pressure.  He is able to sleep throughout the night without waking up coughing.  No known COVID or flu exposure.  No antibiotics in the past 3 months.  No antipyretic in the past 6 hours.  He tried sitting up and Tylenol  500 mg with improvement in his symptoms.  His cough is worse with lying down flat  Has a past medical history of hypertension, diabetes.  No history of pulmonary disease, smoking.  [facility]P: [facility].  Past Medical History:  Diagnosis Date   Basal cell carcinoma    Back, shoulder, chest, bil arm, and nose   HTN (hypertension)    Type 2 diabetes mellitus (HCC)     Past Surgical History:  Procedure Laterality Date   APPENDECTOMY  1964   BASAL CELL CARCINOMA EXCISION     Shoulder, Chest, Back, Bil Arms, R ear, and nose   EXCISION MELANOMA WITH SENTINEL LYMPH NODE BIOPSY N/A 04/04/2021   Procedure: WIDE LOCAL EXCISION, ADVANCED FLAP CLOSURE LEFT FOREARM MELANOMA WITH SENTINEL LYMPH NODE BIOPSY AND MAPPING;  Surgeon: Aron Shoulders, MD;  Location: [facility] OR;  Service: General;  Laterality: N/A;  GEN AND PEC BLOCK   EYE SURGERY Bilateral    cataracts   FINGER FRACTURE SURGERY Right    Right middle finger   WISDOM TOOTH EXTRACTION Bilateral     Family History  Problem Relation Age of Onset   Diabetes Brother    Neuropathy Brother     Social History[1]  Current Medications[2]  Allergies[3]   ROS  As noted in HPI.   Physical Exam  BP (!) 152/80 (BP Location: Left Arm)   Pulse (!) 53   Temp 97.9 F (36.6 C) (Oral)   SpO2 96%    Constitutional: Well developed, well nourished, no acute distress . coughing. Eyes: PERRL, EOMI, conjunctiva normal bilaterally HENT: Normocephalic, atraumatic,mucus membranes moist.  Erythematous, swollen turbinates.  Clear nasal congestion.  Normal oropharynx.  No maxillary, frontal sinus tenderness. Respiratory: Cough with exhalation, positive diffuse expiratory wheezing, scattered rhonchi.  No rales.  No anterior, lateral chest wall tenderness Cardiovascular: Normal rate and rhythm, no murmurs, no gallops, no rubs GI: nondistended skin: No rash, skin intact Musculoskeletal: no deformities Neurologic: Alert & oriented x 3, CN III-XII grossly intact, no motor deficits, sensation grossly intact Psychiatric: Speech and behavior appropriate   ED Course   Medications - No data to display  No orders of the defined types were placed in this encounter.  No results found for this or any previous visit (from the past 24 hours). No results found.  ED Clinical Impression  1. Acute bronchitis, unspecified organism   2. Nasal congestion      ED Assessment/Plan    Patient declined COVID and flu testing  Patient presents with a bronchitis/airway irritation.  Will send home with Atrovent  nasal spray, saline nasal irrigation, plain Mucinex, Promethazine  DM, prednisone  40 mg for 5 days.  Advised that this would elevate his sugars.  I wanted to send him home with an albuterol inhaler with a spacer, but  he states that he cannot tolerate this although he does not remember the exact reaction.  Discussed with him that there are no clear indications for antibiotics at this time.  Deferring chest x-ray in the absence of fevers or focal lung findings.  Discussed MDM, treatment plan, and plan for follow-up with patient Discussed sn/sx that should prompt return to the ED. patient agrees with plan.   Meds ordered this encounter  Medications   promethazine -dextromethorphan (PROMETHAZINE -DM) 6.25-15  MG/5ML syrup    Sig: Take 5 mLs by mouth 4 (four) times daily as needed for cough.    Dispense:  118 mL    Refill:  0   ipratropium (ATROVENT ) 0.06 % nasal spray    Sig: Place 2 sprays into both nostrils 4 (four) times daily.    Dispense:  15 mL    Refill:  0   predniSONE  (DELTASONE ) 20 MG tablet    Sig: Take 2 tablets (40 mg total) by mouth daily with breakfast for 5 days.    Dispense:  10 tablet    Refill:  0      *This clinic note was created using Scientist, clinical (histocompatibility and immunogenetics). Therefore, there may be occasional mistakes despite careful proofreading. ?      [1]  Social History Tobacco Use   Smoking status: Never   Smokeless tobacco: Never  Vaping Use   Vaping status: Never Used  Substance Use Topics   Alcohol use: Not Currently   Drug use: Never  [2] No current facility-administered medications for this encounter.  Current Outpatient Medications:    APPLE CIDER VINEGAR PO, Take 1 tablet by mouth daily., Disp: , Rfl:    atorvastatin (LIPITOR) 20 MG tablet, Take 20 mg by mouth daily., Disp: , Rfl:    ipratropium (ATROVENT ) 0.06 % nasal spray, Place 2 sprays into both nostrils 4 (four) times daily., Disp: 15 mL, Rfl: 0   metFORMIN (GLUCOPHAGE) 1000 MG tablet, Take 1,000 mg by mouth 2 (two) times daily with a meal., Disp: , Rfl:    Multiple Vitamin (MULTIVITAMINS PO), Take 1 tablet by mouth daily., Disp: , Rfl:    Multiple Vitamins-Minerals (AIRBORNE) TBEF, Take 1 tablet by mouth daily., Disp: , Rfl:    NONFORMULARY OR COMPOUNDED ITEM, [facility]:  Onychomycosis Nail Lacquer - Fluconazole 2%, Terbinafine 1%, DMSO, apply to affected area daily., Disp: 120 each, Rfl: 11   predniSONE  (DELTASONE ) 20 MG tablet, Take 2 tablets (40 mg total) by mouth daily with breakfast for 5 days., Disp: 10 tablet, Rfl: 0   promethazine -dextromethorphan (PROMETHAZINE -DM) 6.25-15 MG/5ML syrup, Take 5 mLs by mouth 4 (four) times daily as needed for cough., Disp: 118 mL, Rfl: 0    valsartan-hydrochlorothiazide (DIOVAN-HCT) 80-12.5 MG tablet, Take 1 tablet by mouth daily., Disp: , Rfl:  [3]  Allergies Allergen Reactions   Chromium     Other reaction(s): dizziness   Wound Dressing Adhesive     Other reaction(s): rash Other reaction(s): rash Other reaction(s): rash     Van Knee, MD 10/17/24 1509  "

## 2024-10-16 NOTE — Discharge Instructions (Signed)
 Saline nasal irrigation with a NeilMed sinus rinse and distilled water as often if you want.  Consider plain Mucinex.  This will help make the mucus thinner and easier to blow and cough out.  Promethazine  DM as needed for cough, prednisone  40 mg for 5 days will help with the inflammation in your lungs and your sinuses.  Atrovent  nasal spray for nasal congestion and postnasal drip.  You are still sick in a week, or you start having fevers, follow-up with your primary care provider or return here for reevaluation and consideration of antibiotics.

## 2024-10-25 ENCOUNTER — Ambulatory Visit
Admission: RE | Admit: 2024-10-25 | Discharge: 2024-10-25 | Disposition: A | Discharge status: Home / Self Care | Payer: Self-pay | Source: Ambulatory Visit | Attending: Nurse Practitioner | Admitting: Nurse Practitioner

## 2024-10-25 VITALS — BP 151/80 | HR 75 | Temp 97.8°F | Resp 18 | Wt 231.0 lb

## 2024-10-25 DIAGNOSIS — J209 Acute bronchitis, unspecified: Secondary | ICD-10-CM

## 2024-10-25 DIAGNOSIS — R051 Acute cough: Secondary | ICD-10-CM | POA: Diagnosis not present

## 2024-10-25 MED ORDER — AMOXICILLIN-POT CLAVULANATE 875-125 MG PO TABS: 1.0000 | ORAL_TABLET | Freq: Two times a day (BID) | ORAL | 0 refills | Status: AC

## 2024-10-25 MED ORDER — ALBUTEROL SULFATE HFA 108 (90 BASE) MCG/ACT IN AERS: 2.0000 | INHALATION_SPRAY | RESPIRATORY_TRACT | 0 refills | Status: AC | PRN

## 2024-10-25 MED ORDER — BENZONATATE 200 MG PO CAPS: 200.0000 mg | ORAL_CAPSULE | Freq: Three times a day (TID) | ORAL | 0 refills | Status: AC | PRN

## 2024-10-25 NOTE — Discharge Instructions (Signed)
 Overall, your cough is improving.  You still have some wheezing in your lower lungs thought.  Will plan treatment with an Albuterol inhaler which will help open your lungs and breathe better. I've prescribed Tessalon Perles that you can take during the daytime to help alleviate the cough. If the cough begins to worsen and you're coughing up more phlegm, I've prescribed an antibiotic for you to begin (watch and wait for now). If you develop a fever, chest pain, shortness of breath, or worsening symptoms - return for evaluation.

## 2024-10-25 NOTE — ED Provider Notes (Signed)
 " [facility]    CSN: [id number] Arrival date & time: 10/25/24  1550      History   Chief Complaint Chief Complaint  Patient presents with   Cough    Was seen on 10/16/24 for acute bronchitis. Still having a productive cough. - Entered by patient    HPI Manuel Romero is a 68 y.o. male.   Patient presents for reevaluation of a cough that has been ongoing over the past week or so.  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  He was evaluated on 10/16/2024 and given a prescription for Promethazine  DM and prednisone  which he has taken as prescribed.  States his symptoms are beginning to improve but he still does have a residual cough.  It seems to be exacerbated when he lies down at night to sleep.  It is still productive of light yellow sputum.  No overt fever, chest pain, shortness of breath, abdominal pain, vomiting, or diarrhea.  No current URI symptoms.  The history is provided by the patient.  Cough Associated symptoms: no chest pain, no chills, no fever, no headaches, no myalgias, no rash, no rhinorrhea, no shortness of breath and no sore throat     Past Medical History:  Diagnosis Date   Basal cell carcinoma    Back, shoulder, chest, bil arm, and nose   HTN (hypertension)    Type 2 diabetes mellitus [facility])     Patient Active Problem List   Diagnosis Date Noted   Personal history of malignant melanoma of skin 03/26/2022   Standard chest x-ray abnormal 12/23/2021   Malignant melanoma of left forearm (HCC) 05/03/2021   Allergic rhinitis 03/20/2021   Hyperlipidemia 03/20/2021   Obesity 03/20/2021   Peripheral venous insufficiency 03/20/2021   Pure hypercholesterolemia 03/20/2021   Type 2 diabetes mellitus (HCC) 05/16/2018   HTN (hypertension) 05/16/2018    Past Surgical History:  Procedure Laterality Date   APPENDECTOMY  1964   BASAL CELL CARCINOMA EXCISION     Shoulder, Chest, Back, Bil Arms, R ear, and  nose   EXCISION MELANOMA WITH SENTINEL LYMPH NODE BIOPSY N/A 04/04/2021   Procedure: WIDE LOCAL EXCISION, ADVANCED FLAP CLOSURE LEFT FOREARM MELANOMA WITH SENTINEL LYMPH NODE BIOPSY AND MAPPING;  Surgeon: Aron Shoulders, MD;  Location: [facility] OR;  Service: General;  Laterality: N/A;  GEN AND PEC BLOCK   EYE SURGERY Bilateral    cataracts   FINGER FRACTURE SURGERY Right    Right middle finger   WISDOM TOOTH EXTRACTION Bilateral       Home Medications    Prior to Admission medications  Medication Sig Start Date End Date Taking? Authorizing Provider  albuterol (VENTOLIN HFA) 108 (90 Base) MCG/ACT inhaler Inhale 2 puffs into the lungs every 4 (four) hours as needed for wheezing or shortness of breath. 10/25/24  Yes Janet Therisa PARAS, FNP  amoxicillin-clavulanate (AUGMENTIN) 875-125 MG tablet Take 1 tablet by mouth every 12 (twelve) hours for 7 days. 10/25/24 11/01/24 Yes Janet Therisa PARAS, FNP  benzonatate (TESSALON) 200 MG capsule Take 1 capsule (200 mg total) by mouth every 8 (eight) hours as needed for cough. 10/25/24  Yes Janet Therisa PARAS, FNP  APPLE CIDER VINEGAR PO Take 1 tablet by mouth daily.    [provider]  atorvastatin (LIPITOR) 20 MG tablet Take 20 mg by mouth daily. 08/26/17   [provider]  ipratropium (ATROVENT ) 0.06 % nasal spray Place 2 sprays  into both nostrils 4 (four) times daily. 10/16/24   Van Knee, MD  metFORMIN (GLUCOPHAGE) 1000 MG tablet Take 1,000 mg by mouth 2 (two) times daily with a meal.    [provider]  Multiple Vitamin (MULTIVITAMINS PO) Take 1 tablet by mouth daily.    [provider]  Multiple Vitamins-Minerals (AIRBORNE) TBEF Take 1 tablet by mouth daily.    [provider]  NONFORMULARY OR COMPOUNDED ITEM [facility]:  Onychomycosis Nail Lacquer - Fluconazole 2%, Terbinafine 1%, DMSO, apply to affected area daily. 05/14/18   Gaynel Delon CROME, DPM  promethazine -dextromethorphan (PROMETHAZINE -DM) 6.25-15 MG/5ML  syrup Take 5 mLs by mouth 4 (four) times daily as needed for cough. 10/16/24   Van Knee, MD  valsartan-hydrochlorothiazide (DIOVAN-HCT) 80-12.5 MG tablet Take 1 tablet by mouth daily.    [provider]    Family History Family History  Problem Relation Age of Onset   Diabetes Brother    Neuropathy Brother     Social History Social History[1]   Allergies   Chromium and Wound dressing adhesive   Review of Systems Review of Systems  Constitutional:  Negative for chills and fever.  HENT:  Negative for congestion, rhinorrhea and sore throat.   Respiratory:  Positive for cough. Negative for chest tightness and shortness of breath.   Cardiovascular:  Negative for chest pain.  Gastrointestinal:  Negative for abdominal pain, diarrhea and vomiting.  Genitourinary:  Negative for dysuria.  Musculoskeletal:  Negative for back pain and myalgias.  Skin:  Negative for rash.  Neurological:  Negative for dizziness and headaches.   Physical Exam Triage Vital Signs ED Triage Vitals  Encounter Vitals Group     BP 10/25/24 1620 (!) 151/80     Girls Systolic BP Percentile --      Girls Diastolic BP Percentile --      Boys Systolic BP Percentile --      Boys Diastolic BP Percentile --      Pulse Rate 10/25/24 1620 75     Resp 10/25/24 1620 18     Temp 10/25/24 1620 97.8 F (36.6 C)     Temp Source 10/25/24 1620 Oral     SpO2 10/25/24 1620 96 %     Weight 10/25/24 1619 231 lb 0.7 oz (104.8 kg)     Height --      Head Circumference --      Peak Flow --      Pain Score 10/25/24 1619 0     Pain Loc --      Pain Education --      Exclude from Growth Chart --    No data found.  Updated Vital Signs BP (!) 151/80 (BP Location: Left Arm)   Pulse 75   Temp 97.8 F (36.6 C) (Oral)   Resp 18   Wt 231 lb 0.7 oz (104.8 kg)   SpO2 96%   BMI 28.12 kg/m   Physical Exam Vitals and nursing note reviewed.  Constitutional:      Appearance: Normal appearance.   Cardiovascular:     Rate and Rhythm: Normal rate and regular rhythm.     Heart sounds: Normal heart sounds.  Pulmonary:     Effort: Pulmonary effort is normal.     Breath sounds: Wheezing (bilateral lower lung fields) present. No rhonchi or rales.  Abdominal:     General: Bowel sounds are normal.  Skin:    General: Skin is warm and dry.  Neurological:  General: No focal deficit present.     Mental Status: He is alert and oriented to person, place, and time.  Psychiatric:        Mood and Affect: Mood normal.        Behavior: Behavior normal.        Thought Content: Thought content normal.        Judgment: Judgment normal.    UC Treatments / Results  Labs (all labs ordered are listed, but only abnormal results are displayed) Labs Reviewed - No data to display  EKG   Radiology No results found.  Procedures Procedures (including critical care time)  Medications Ordered in UC Medications - No data to display  Initial Impression / Assessment and Plan / UC Course  I have reviewed the triage vital signs and the nursing notes.  Pertinent labs & imaging results that were available during my care of the patient were reviewed by me and considered in my medical decision making (see chart for details).    Patient presents requesting reevaluation for an acute cough/bronchitis-was previously evaluated on 10/16/2024 and given prescription for cough syrup and prednisone  which he has used as prescribed.  He endorses that he is feeling better overall but still has a persistent nagging cough.  On physical exam, he does have some diffuse expiratory wheezing in the lower lungs bilaterally.  His vital signs are reassuring.  He was given a prescription for an inhaler and Tessalon Perles for additional symptom management.  I have also provided a 'watch and wait' prescription for Augmentin should his sputum production increase or he begins feeling poorly again.  I discussed use of Zyrtec and  Flonase if he is good to be outside working.  Recommend return evaluation if he develops fever, chest pain, or shortness of breath-will consider imaging at that time.  Final Clinical Impressions(s) / UC Diagnoses   Final diagnoses:  Acute cough  Acute bronchitis, unspecified organism     Discharge Instructions      Overall, your cough is improving.  You still have some wheezing in your lower lungs thought.  Will plan treatment with an Albuterol inhaler which will help open your lungs and breathe better. I've prescribed Tessalon Perles that you can take during the daytime to help alleviate the cough. If the cough begins to worsen and you're coughing up more phlegm, I've prescribed an antibiotic for you to begin (watch and wait for now). If you develop a fever, chest pain, shortness of breath, or worsening symptoms - return for evaluation.    ED Prescriptions     Medication Sig Dispense Auth. Provider   albuterol (VENTOLIN HFA) 108 (90 Base) MCG/ACT inhaler Inhale 2 puffs into the lungs every 4 (four) hours as needed for wheezing or shortness of breath. 18 g Janet Therisa PARAS, FNP   benzonatate (TESSALON) 200 MG capsule Take 1 capsule (200 mg total) by mouth every 8 (eight) hours as needed for cough. 30 capsule Janet Therisa PARAS, FNP   amoxicillin-clavulanate (AUGMENTIN) 875-125 MG tablet Take 1 tablet by mouth every 12 (twelve) hours for 7 days. 14 tablet Janet Therisa PARAS, FNP      PDMP not reviewed this encounter.    [1]  Social History Tobacco Use   Smoking status: Never    Passive exposure: Never   Smokeless tobacco: Never  Vaping Use   Vaping status: Never Used  Substance Use Topics   Alcohol use: Not Currently   Drug use: Never  Janet Therisa PARAS, FNP 10/25/24 1712  "

## 2024-10-25 NOTE — ED Triage Notes (Signed)
 Was seen on 10/16/24 for acute bronchitis. Still having a productive cough. - Entered by patient  Pt c/o cough x 10 days. Pt reports he has taken all the Prednisone  and is almost finish with the cough syrup.

## 2024-11-01 ENCOUNTER — Encounter: Payer: Self-pay | Admitting: Podiatry

## 2024-11-01 ENCOUNTER — Ambulatory Visit: Admitting: Podiatry

## 2024-11-01 DIAGNOSIS — M79674 Pain in right toe(s): Secondary | ICD-10-CM | POA: Diagnosis not present

## 2024-11-01 DIAGNOSIS — L84 Corns and callosities: Secondary | ICD-10-CM

## 2024-11-01 DIAGNOSIS — B351 Tinea unguium: Secondary | ICD-10-CM

## 2024-11-01 DIAGNOSIS — E1151 Type 2 diabetes mellitus with diabetic peripheral angiopathy without gangrene: Secondary | ICD-10-CM | POA: Diagnosis not present

## 2024-11-01 DIAGNOSIS — M79675 Pain in left toe(s): Secondary | ICD-10-CM | POA: Diagnosis not present

## 2024-11-06 ENCOUNTER — Encounter: Payer: Self-pay | Admitting: Podiatry

## 2024-11-06 NOTE — Progress Notes (Signed)
 "  Subjective:  Patient ID: Manuel Romero, male    DOB: 28-May-1956,  MRN: [id number]  Manuel Romero presents to clinic today for at risk foot care. Pt has h/o NIDDM with PAD and callus(es) medial IPJ of right great toe, submet head 2 left foot, and submet head 3 right foot and painful mycotic toenails that are difficult to trim. Painful toenails interfere with ambulation. Aggravating factors include wearing enclosed shoe gear. Pain is relieved with periodic professional debridement. Painful calluses are aggravated when weightbearing with and without shoegear. Pain is relieved with periodic professional debridement.  Chief Complaint  Patient presents with   Diabetes    Cut my toenails and sand them callused toes. Saw Turmel, Caleb P, PA. - 09/13/2024; A1c - 6.8      New problem(s): None.   PCP is Turmel, Caleb P, PA.  Allergies[1]  Review of Systems: Negative except as noted in the HPI.  Objective:  There were no vitals filed for this visit. Manuel Romero is a pleasant 69 y.o. male WD, WN in NAD. AAO x 3.  Vascular Examination: Capillary refill time immediate b/l. Vascular status intact b/l with palpable pedal pulses. Pedal hair present b/l. No pain with calf compression b/l. Skin temperature gradient WNL b/l. No cyanosis or clubbing b/l. No ischemia or gangrene noted b/l. Evidence of skin changes consistent with long term venous stasis BLE.  Neurological Examination: Sensation grossly intact b/l with 10 gram monofilament. Vibratory sensation intact b/l.   Dermatological Examination: Pedal skin with normal turgor, texture and tone b/l.  No open wounds. No interdigital macerations.   Toenails 1-5 b/l thick, discolored, elongated with subungual debris and pain on dorsal palpation.   Hyperkeratotic lesion(s) medial IPJ of right great toe, submet head 2 left foot, and submet head 3 right foot.  No erythema, no edema, no drainage, no fluctuance.  Musculoskeletal Examination: Muscle  strength 5/5 to all lower extremity muscle groups bilaterally. HAV with bunion deformity noted b/l LE.  Radiographs: None  Assessment/Plan: 1. Pain due to onychomycosis of toenails of both feet   2. Callus   3. Type II diabetes mellitus with peripheral circulatory disorder [facility])   Patient was evaluated and treated. All patient's and/or POA's questions/concerns addressed on today's visit. Mycotic toenails 1-5 b/l debrided in length and girth without incident. Callus(es) medial IPJ of right great toe, submet head 2 left foot, and submet head 3 right foot pared with sharp debridement without incident. Continue daily foot inspections and monitor blood glucose per PCP/Endocrinologist's recommendations. Continue soft, supportive shoe gear daily. Report any pedal injuries to medical professional. Call office if there are any questions/concerns.  Return in about 3 months (around 01/30/2025).  Manuel Romero, DPM      [place] LOCATION: [address] [place] [address]                   Office [phone]   [facility] LOCATION: [address] [place], [place] [address] Office [phone]     [1]  Allergies Allergen Reactions   Chromium     Other reaction(s): dizziness   Wound Dressing Adhesive     Other reaction(s): rash Other reaction(s): rash  Other reaction(s): rash   "

## 2025-02-07 ENCOUNTER — Ambulatory Visit: Admitting: Podiatry
